# Patient Record
Sex: Female | Born: 2000 | Race: White | Hispanic: No | Marital: Married | Smoking: Never smoker
Health system: Southern US, Community
[De-identification: ages and names within clinical notes are randomized; demographics above are authoritative.]

## PROBLEM LIST (undated history)

## (undated) ENCOUNTER — Emergency Department (HOSPITAL_COMMUNITY): Payer: 59

## (undated) DIAGNOSIS — N809 Endometriosis, unspecified: Secondary | ICD-10-CM

## (undated) DIAGNOSIS — F419 Anxiety disorder, unspecified: Secondary | ICD-10-CM

## (undated) HISTORY — DX: Anxiety disorder, unspecified: F41.9

## (undated) HISTORY — DX: Endometriosis, unspecified: N80.9

## (undated) HISTORY — PX: LAPAROSCOPY: SHX197

## (undated) HISTORY — PX: SEPTOPLASTY: SUR1290

---

## 2021-10-21 ENCOUNTER — Other Ambulatory Visit: Payer: Self-pay

## 2021-10-21 ENCOUNTER — Emergency Department (HOSPITAL_BASED_OUTPATIENT_CLINIC_OR_DEPARTMENT_OTHER)
Admission: EM | Admit: 2021-10-21 | Discharge: 2021-10-21 | Disposition: A | Discharge status: Home / Self Care | Payer: 59 | Attending: Student | Admitting: Student

## 2021-10-21 ENCOUNTER — Encounter (HOSPITAL_BASED_OUTPATIENT_CLINIC_OR_DEPARTMENT_OTHER): Payer: Self-pay

## 2021-10-21 ENCOUNTER — Emergency Department (HOSPITAL_COMMUNITY): Payer: 59

## 2021-10-21 ENCOUNTER — Emergency Department (HOSPITAL_BASED_OUTPATIENT_CLINIC_OR_DEPARTMENT_OTHER): Payer: 59

## 2021-10-21 DIAGNOSIS — R509 Fever, unspecified: Secondary | ICD-10-CM | POA: Diagnosis not present

## 2021-10-21 DIAGNOSIS — R531 Weakness: Secondary | ICD-10-CM | POA: Insufficient documentation

## 2021-10-21 DIAGNOSIS — R519 Headache, unspecified: Secondary | ICD-10-CM | POA: Diagnosis present

## 2021-10-21 DIAGNOSIS — G43809 Other migraine, not intractable, without status migrainosus: Secondary | ICD-10-CM

## 2021-10-21 DIAGNOSIS — R202 Paresthesia of skin: Secondary | ICD-10-CM | POA: Insufficient documentation

## 2021-10-21 LAB — BASIC METABOLIC PANEL
Anion gap: 9 (ref 5–15)
BUN: 11 mg/dL (ref 6–20)
CO2: 24 mmol/L (ref 22–32)
Calcium: 9.5 mg/dL (ref 8.9–10.3)
Chloride: 106 mmol/L (ref 98–111)
Creatinine, Ser: 0.82 mg/dL (ref 0.44–1.00)
GFR, Estimated: >60 mL/min (ref >60)
Glucose, Bld: 123 mg/dL — ABNORMAL HIGH (ref 70–99)
Potassium: 3.8 mmol/L (ref 3.5–5.1)
Sodium: 139 mmol/L (ref 135–145)

## 2021-10-21 LAB — URINALYSIS, ROUTINE W REFLEX MICROSCOPIC
Bilirubin Urine: NEGATIVE
Glucose, UA: NEGATIVE mg/dL
Hgb urine dipstick: NEGATIVE
Ketones, ur: NEGATIVE mg/dL
Leukocytes,Ua: NEGATIVE
Nitrite: NEGATIVE
Protein, ur: NEGATIVE mg/dL
Specific Gravity, Urine: 1.01 (ref 1.005–1.030)
pH: 6 (ref 5.0–8.0)

## 2021-10-21 LAB — CBC
HCT: 36.6 % (ref 36.0–46.0)
Hemoglobin: 11.4 g/dL — ABNORMAL LOW (ref 12.0–15.0)
MCH: 25.7 pg — ABNORMAL LOW (ref 26.0–34.0)
MCHC: 31.1 g/dL (ref 30.0–36.0)
MCV: 82.6 fL (ref 80.0–100.0)
Platelets: 293 10*3/uL (ref 150–400)
RBC: 4.43 MIL/uL (ref 3.87–5.11)
RDW: 14.2 % (ref 11.5–15.5)
WBC: 6.4 10*3/uL (ref 4.0–10.5)
nRBC: 0 % (ref 0.0–0.2)

## 2021-10-21 LAB — PREGNANCY, URINE: Preg Test, Ur: NEGATIVE

## 2021-10-21 IMAGING — CT CT HEAD W/O CM
4 series · 16 of 47 positions shown, 18 images · non-contrast
Comparison: No pertinent prior exams available for comparison.

CLINICAL DATA: Headache, new or worsening, neuro deficit. Headache,
left-sided weakness, chronic migraines (associated with nausea and
blurry vision).



[Series 2: head wo · axial · 0.45mm/px · z∈[+1054,+1174]mm · 7 of 32 slices shown, 9 images]
[im 4/32  brain]
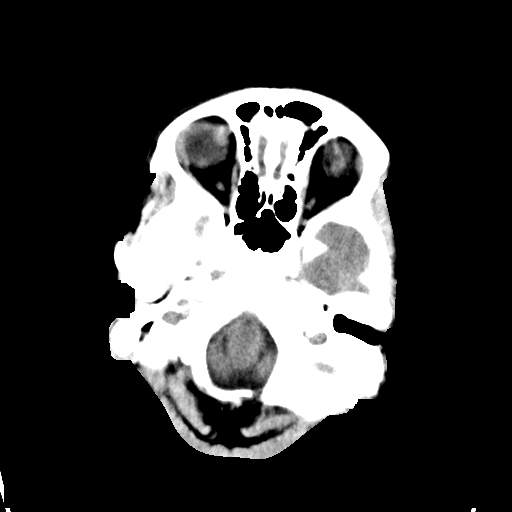
[im 4/32  bone]
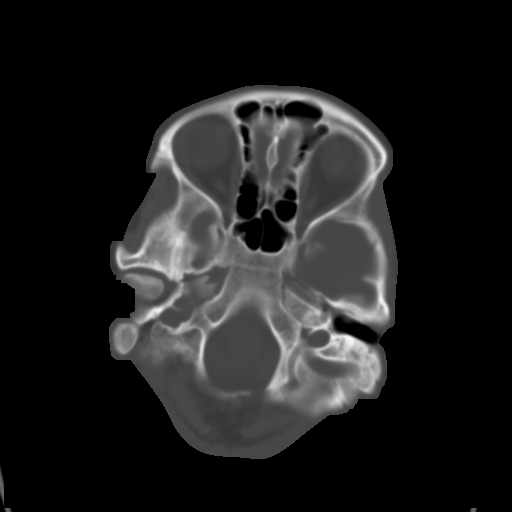
[im 8/32  brain]
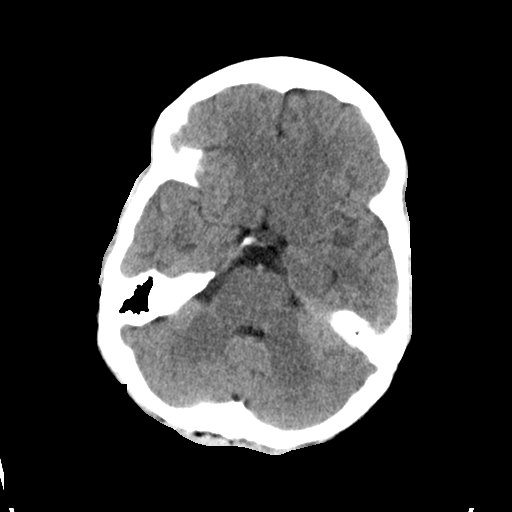
[im 12/32  brain]
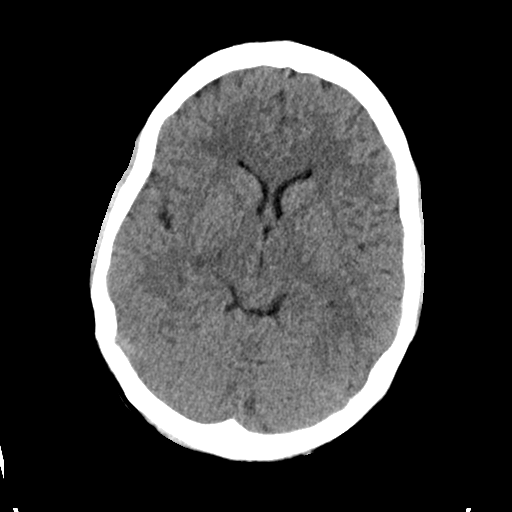
[im 16/32  brain]
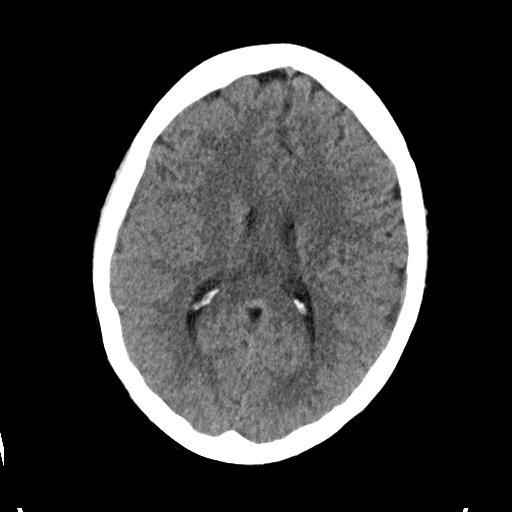
[im 20/32  brain]
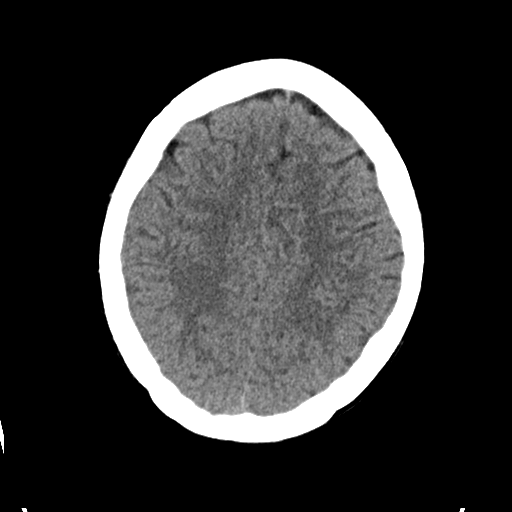
[im 20/32  bone]
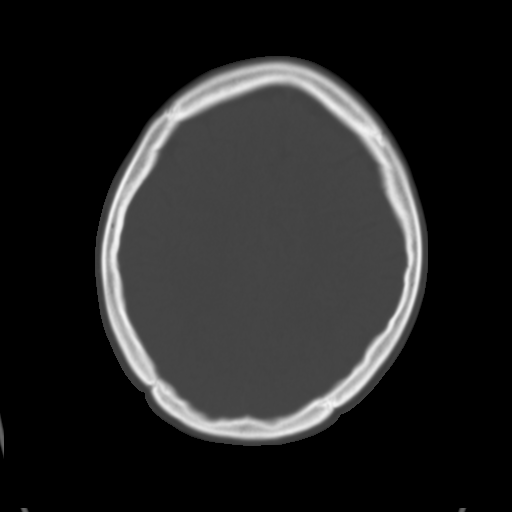
[im 24/32  brain]
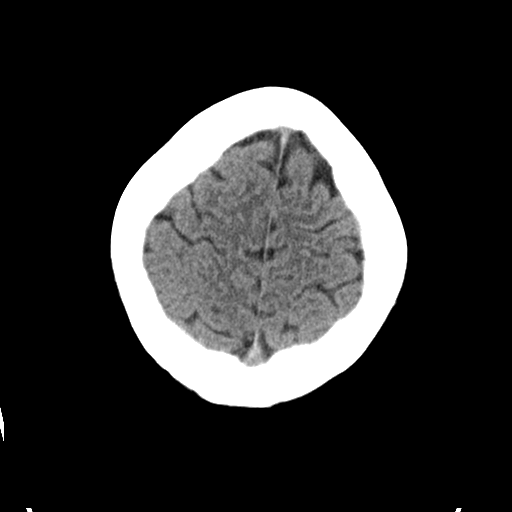
[im 28/32  brain]
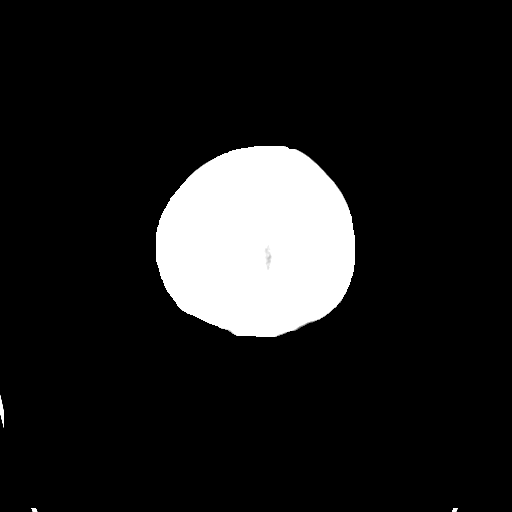

[Series 3: head bone · axial · 0.45mm/px · z∈[+1053,+1085]mm · 3 of 79 slices shown]
[im 8/79  bone]
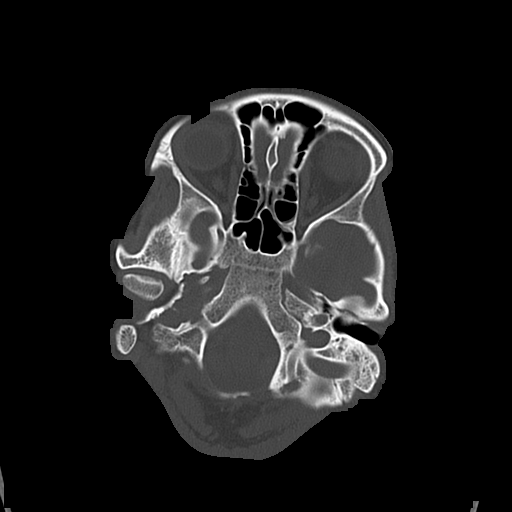
[im 16/79  bone]
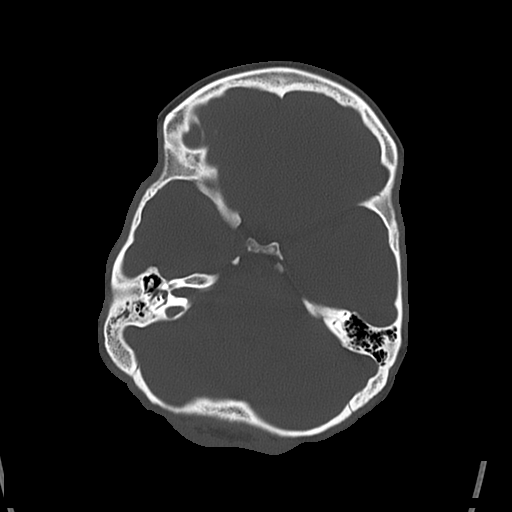
[im 24/79  bone]
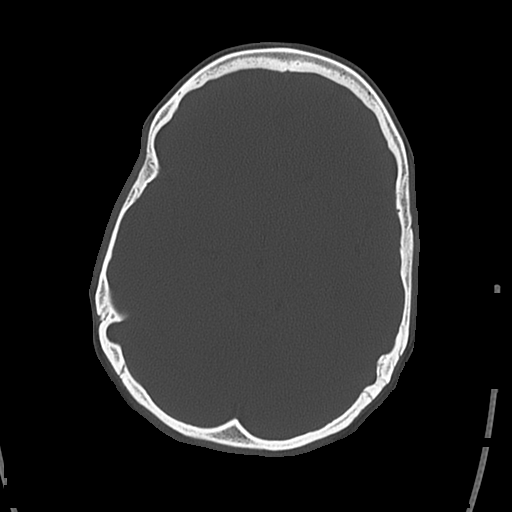

[Series 4: coronal soft · coronal · 0.32mm/px · 3 of 69 slices shown]
[im 26/69  brain]
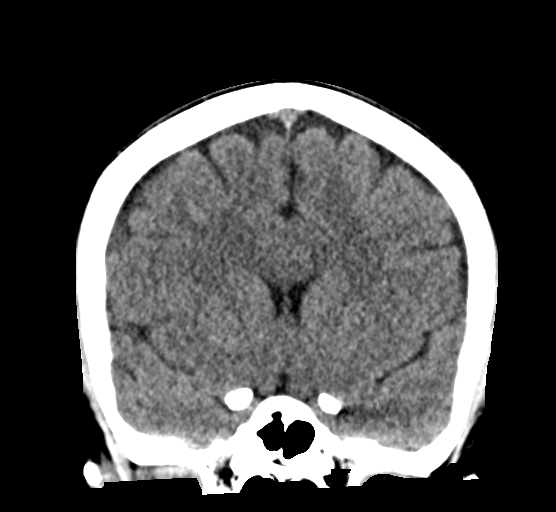
[im 32/69  brain]
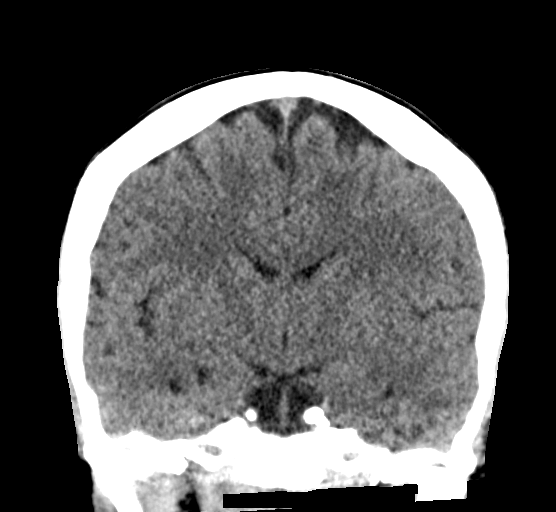
[im 37/69  brain]
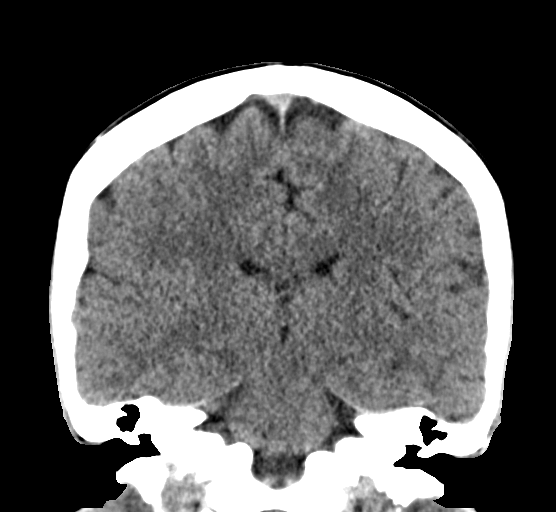

[Series 5: sagittal soft · sagittal · 0.32mm/px · 3 of 60 slices shown]
[im 20/60  brain]
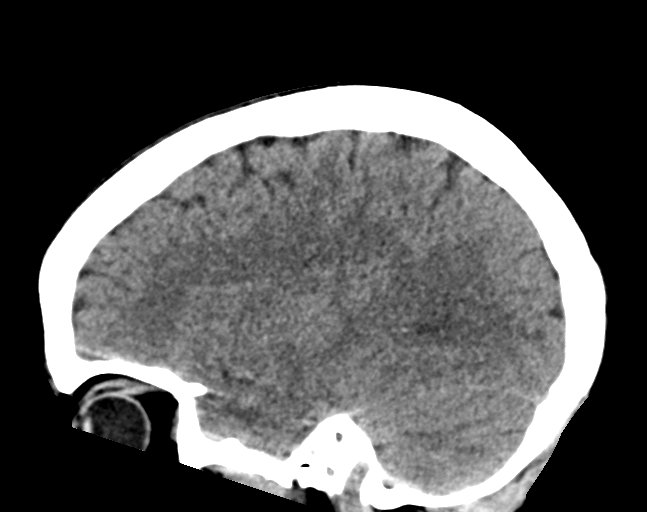
[im 30/60  brain]
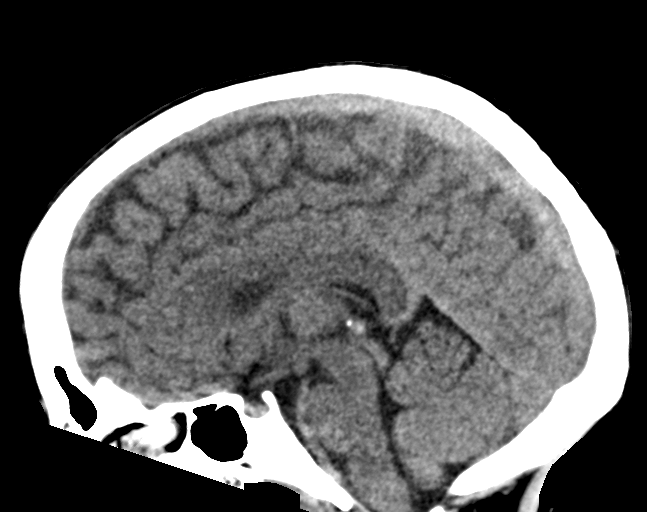
[im 40/60  brain]
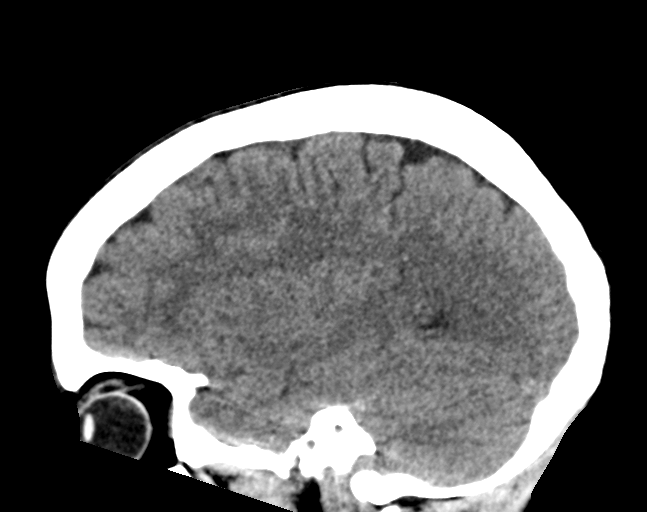

[16 of 47 positions shown; findings below may reference images not displayed]

FINDINGS: Brain:

Cerebral volume is normal.

There is no acute intracranial hemorrhage.

No demarcated cortical infarct.

No extra-axial fluid collection.

No evidence of an intracranial mass.

No midline shift.

Vascular: No hyperdense vessel.

Skull: No calvarial fracture or focal suspicious osseous lesion.

Sinuses/Orbits: Mild mucosal thickening within the bilateral ethmoid
and left maxillary sinuses at the imaged levels.
IMPRESSION: No evidence of acute intracranial abnormality.

Mild mucosal thickening within the bilateral ethmoid and left
maxillary sinuses at the imaged levels.

## 2021-10-21 IMAGING — MR MR HEAD W/O CM
7 of 11 series · 25 of 48 positions shown · non-contrast
Comparison: None.

CLINICAL DATA: Sudden headache with left-sided weakness

EXAM:
MRI HEAD WITHOUT CONTRAST
TECHNIQUE: Multiplanar, multiecho pulse sequences of the brain and surrounding
structures were obtained without intravenous contrast.

[Series 2: DWI · axial · 3.0mm · 0.94mm/px · z∈[-50,+96]mm · 6 of 99 slices shown (1 of 2)]
[im 1/99]
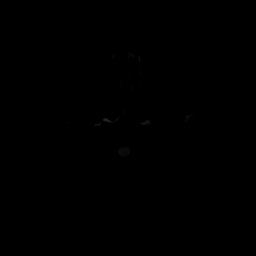
[im 20/99]
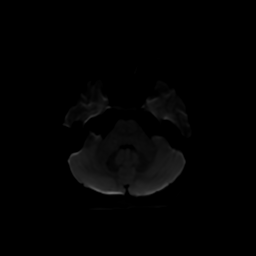
[im 40/99]
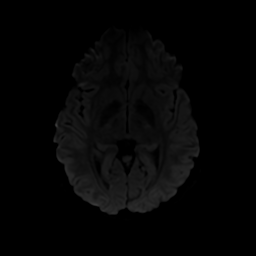
[im 59/99]
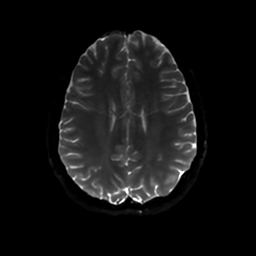
[im 79/99]
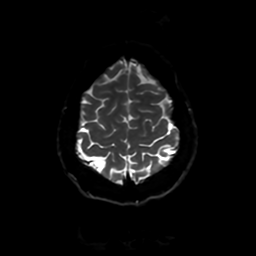
[im 99/99]
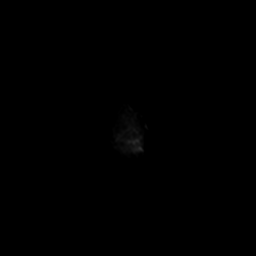

[Series 3: DWI · coronal · 4.0mm · 0.94mm/px · 6 of 78 slices shown (2 of 2)]
[im 1/78]
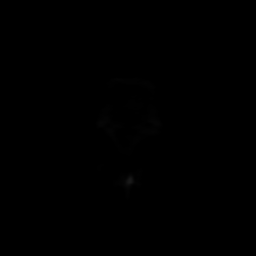
[im 16/78]
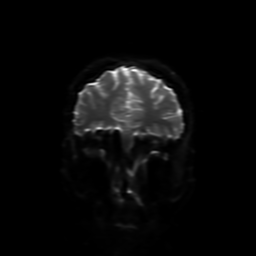
[im 31/78]
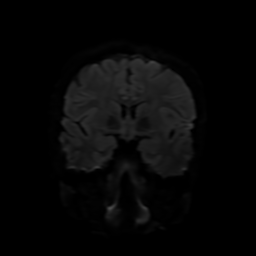
[im 47/78]
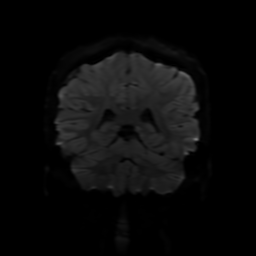
[im 62/78]
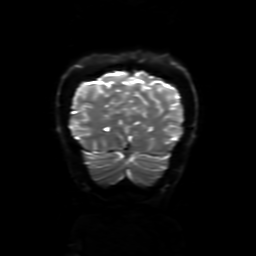
[im 78/78]
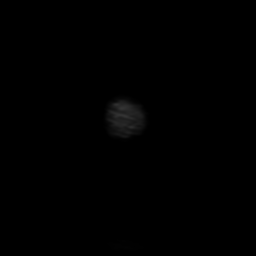

[Series 4: FLAIR · sagittal · 5.0mm · 0.23mm/px · 2 of 26 slices shown (1 of 2)]
[im 1/26]
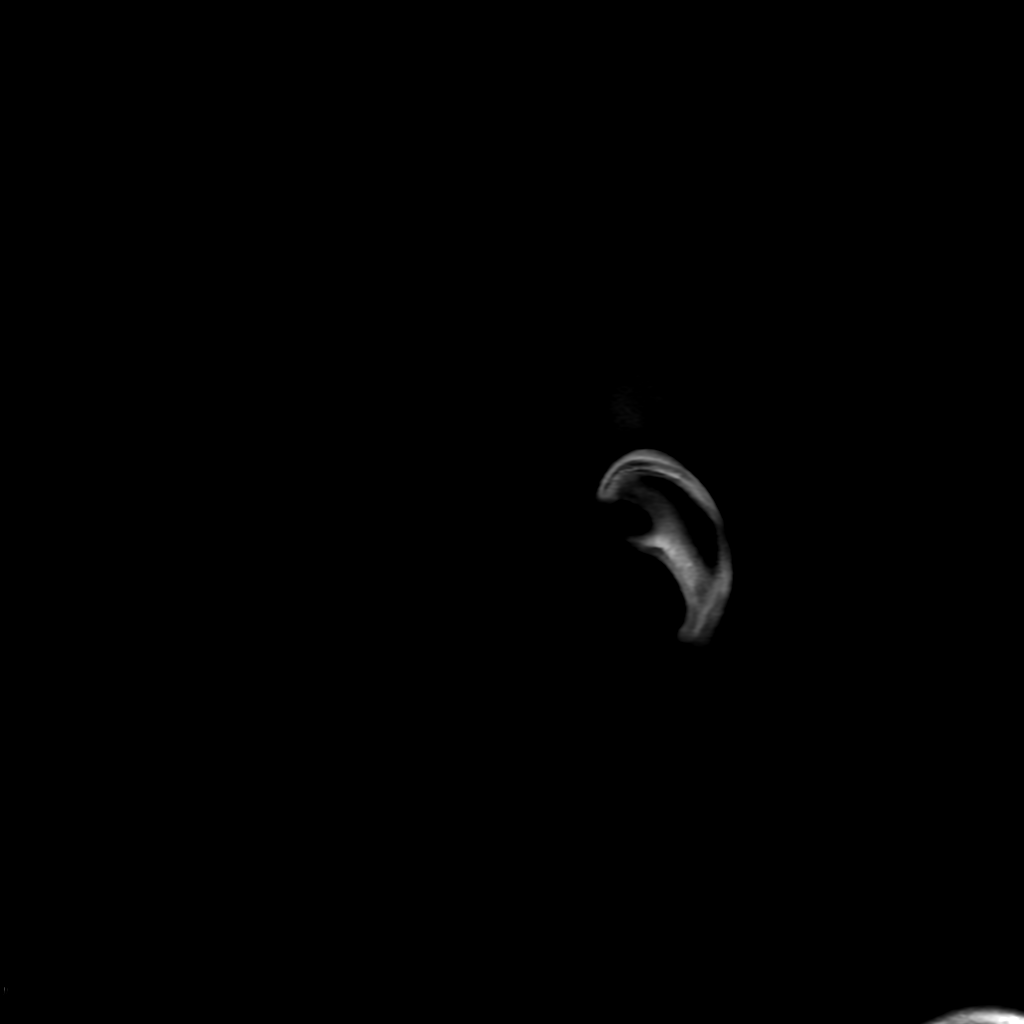
[im 26/26]
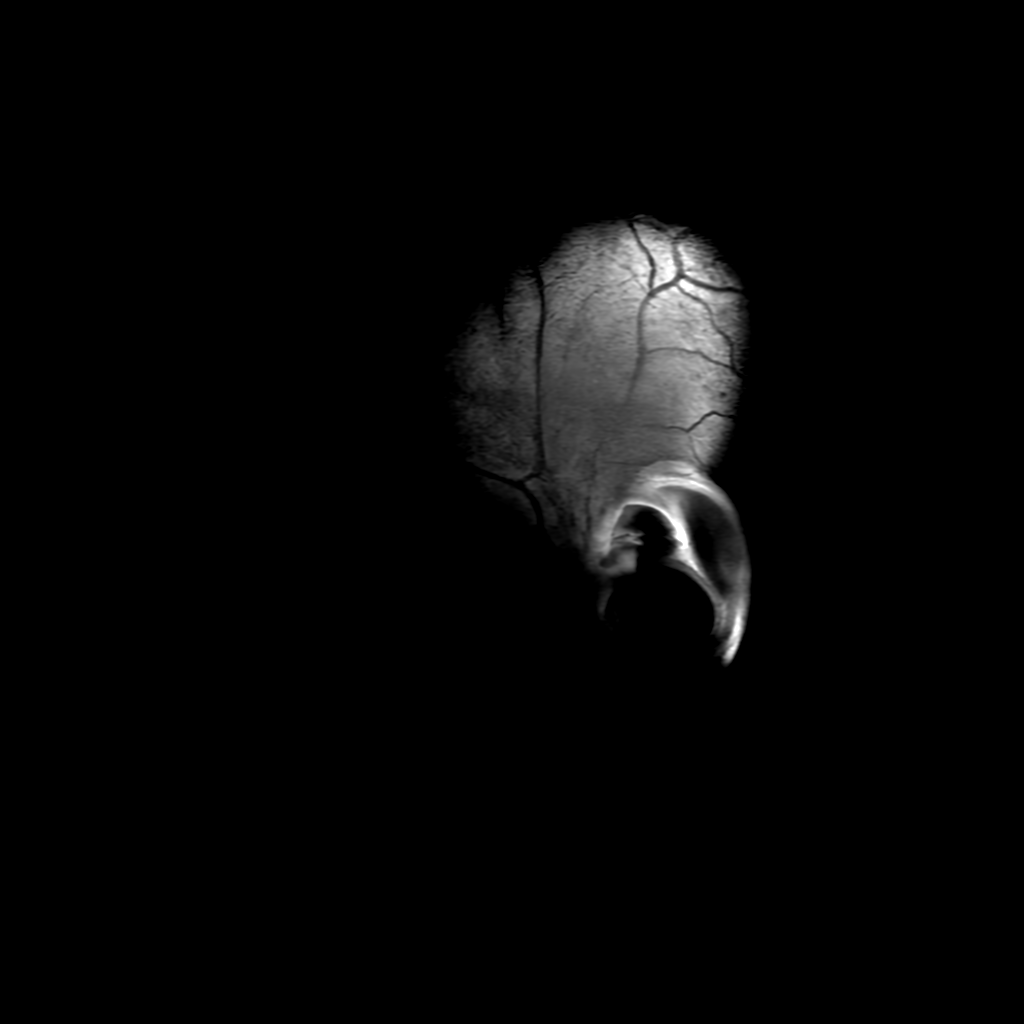

[Series 5: T2 · axial · 5.0mm · 0.23mm/px · 1 of 26 slices shown]
[im 1/26]
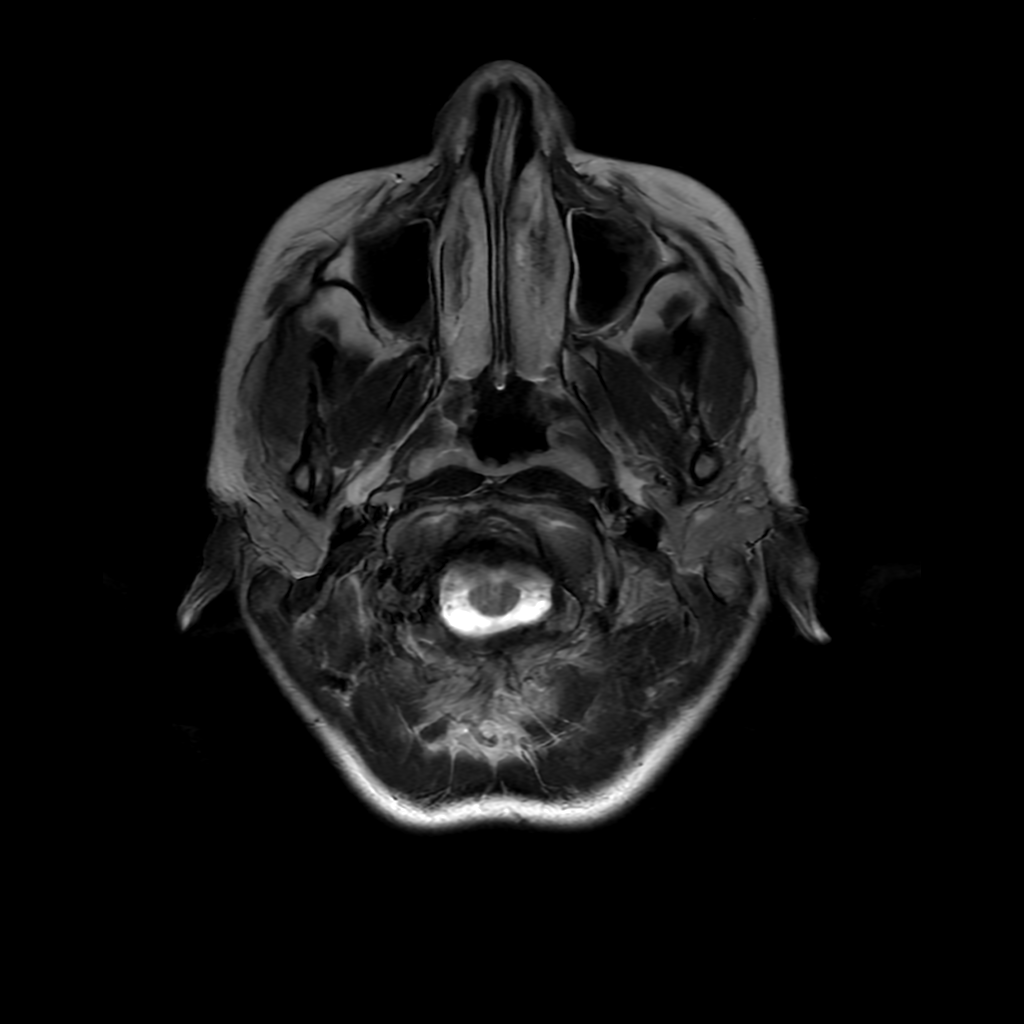

[Series 6: FLAIR · axial · 4.0mm · 0.45mm/px · z∈[-50,+98]mm · 3 of 35 slices shown (2 of 2)]
[im 1/35]
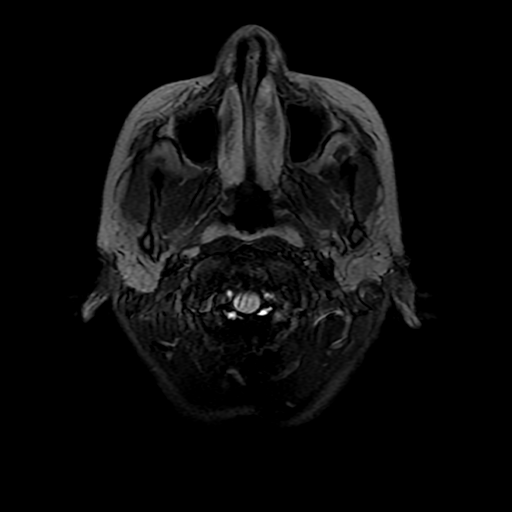
[im 18/35]
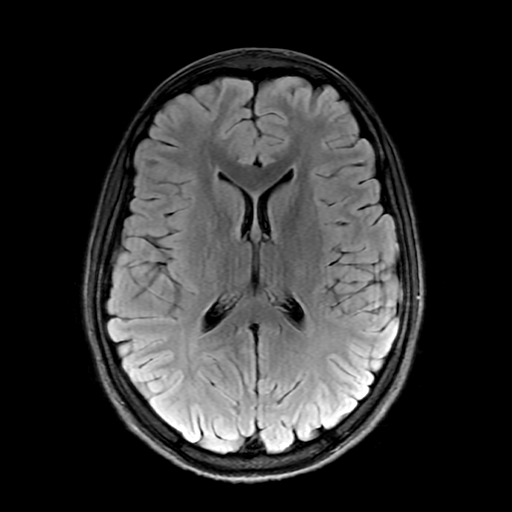
[im 35/35]
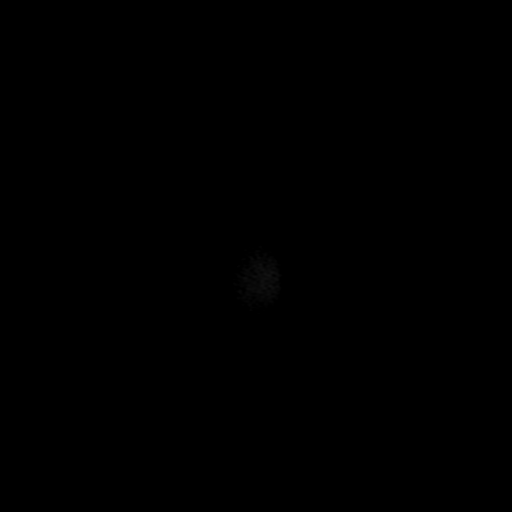

[Series 250: ADC · axial · 3.0mm · 0.94mm/px · z∈[-50,+96]mm · 4 of 50 slices shown (1 of 2)]
[im 1/50]
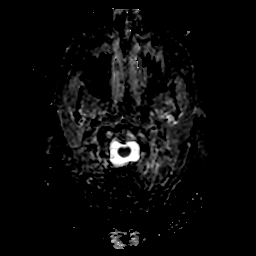
[im 17/50]
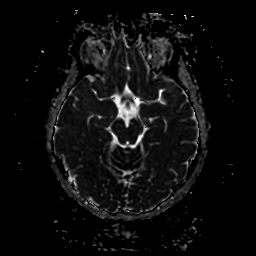
[im 33/50]
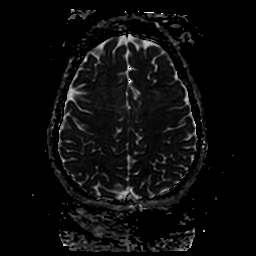
[im 50/50]
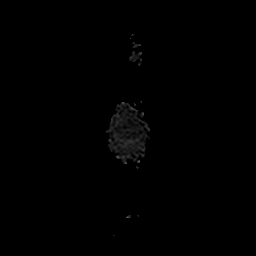

[Series 350: ADC · coronal · 4.0mm · 0.94mm/px · 3 of 39 slices shown (2 of 2)]
[im 1/39]
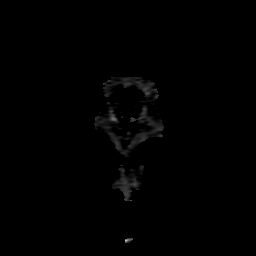
[im 20/39]
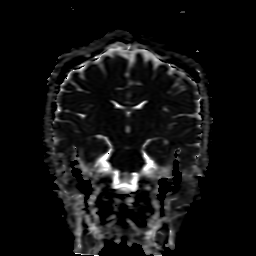
[im 39/39]
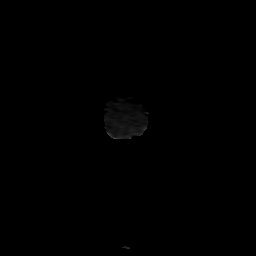

[25 of 48 positions shown; findings below may reference images not displayed]

FINDINGS: Brain: No acute infarct, mass effect or extra-axial collection. No
acute or chronic hemorrhage. Normal white matter signal, parenchymal
volume and CSF spaces. The midline structures are normal.

Vascular: Major flow voids are preserved.

Skull and upper cervical spine: Normal calvarium and skull base.
Visualized upper cervical spine and soft tissues are normal.

Sinuses/Orbits:No paranasal sinus fluid levels or advanced mucosal
thickening. No mastoid or middle ear effusion. Normal orbits.
IMPRESSION: Normal brain MRI.

## 2021-10-21 IMAGING — MR MR MRV HEAD WO/W CM
3 of 4 series · 20 of 48 positions shown · IV contrast (5.5 m GAD)
Comparison: No pertinent prior exam.

CLINICAL DATA: Headache

EXAM:
MR VENOGRAM HEAD WITHOUT AND WITH CONTRAST
TECHNIQUE: Angiographic images of the intracranial venous structures were
acquired using MRV technique without and with intravenous contrast.
CONTRAST:  5.5mL GADAVIST GADOBUTROL 1 MMOL/ML IV SOLN

[Series 3: MRV · coronal · 1.5mm · 0.43mm/px · 7 of 129 slices shown]
[im 1/129]
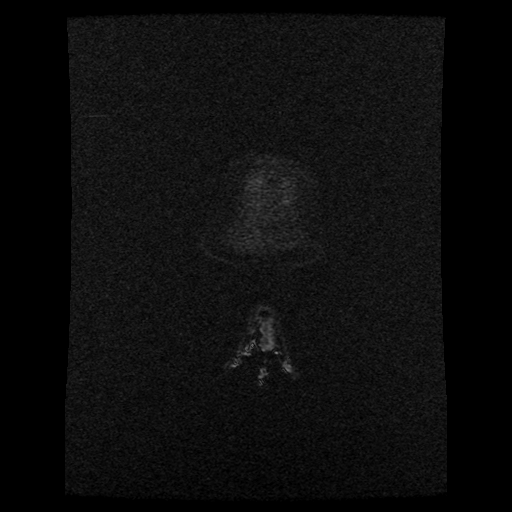
[im 22/129]
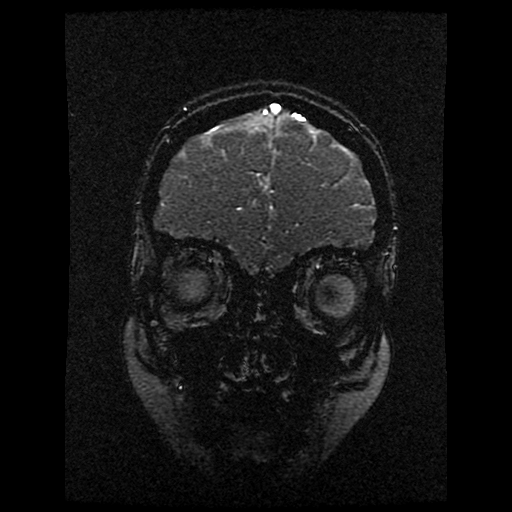
[im 43/129]
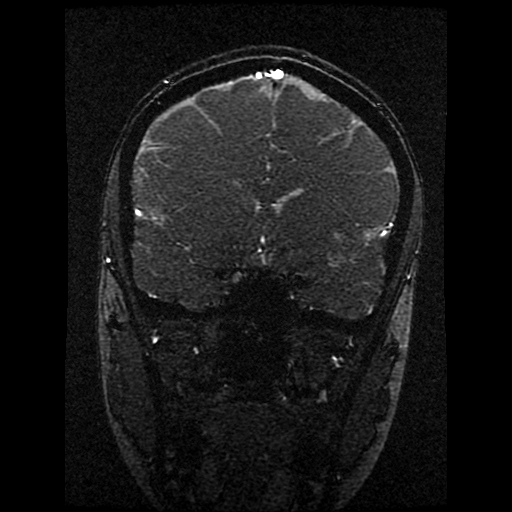
[im 65/129]
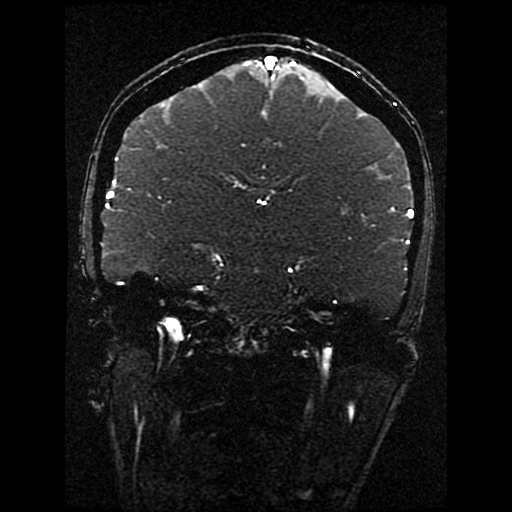
[im 86/129]
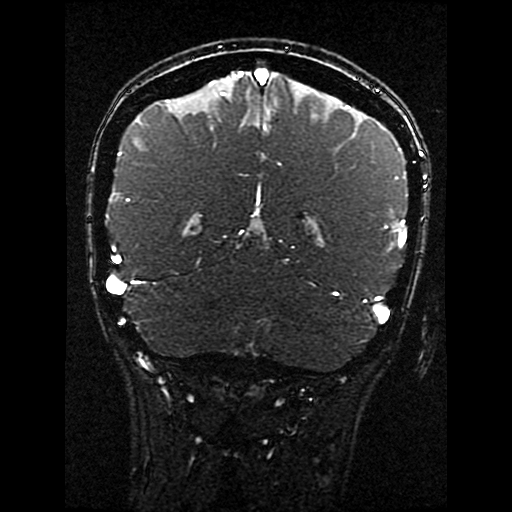
[im 107/129]
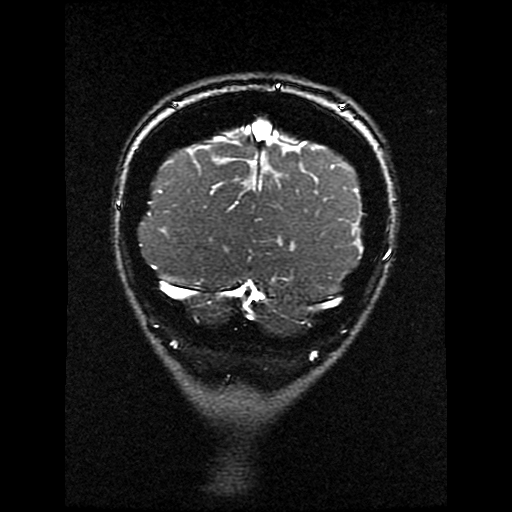
[im 129/129]
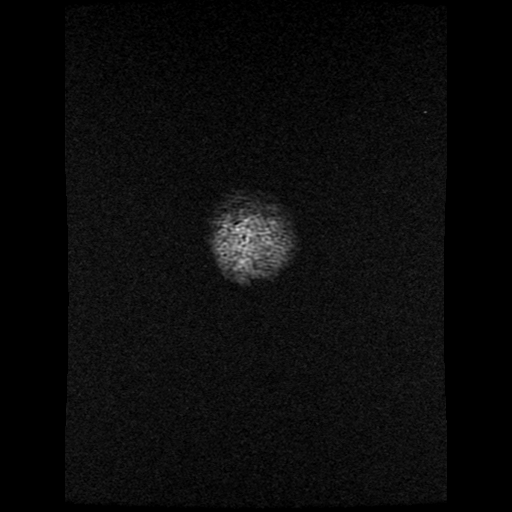

[Series 4: sag inhance (id) · sagittal · 1.8mm · 0.47mm/px · 9 of 331 slices shown]
[im 19/331]
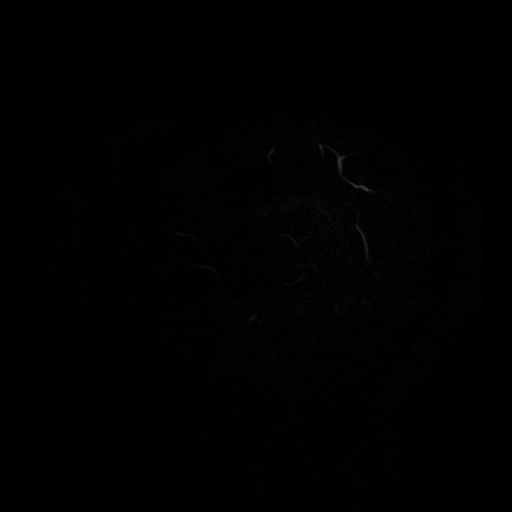
[im 56/331]
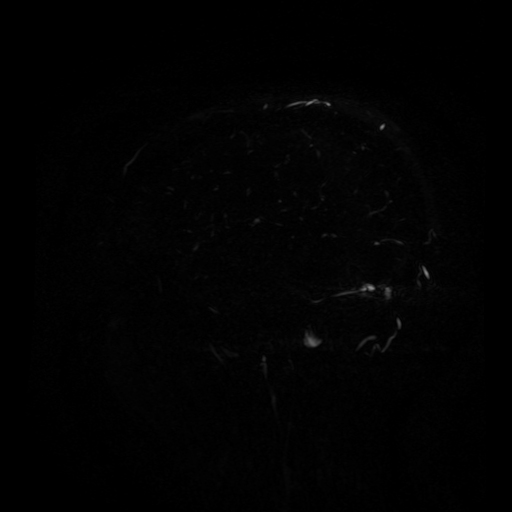
[im 92/331]
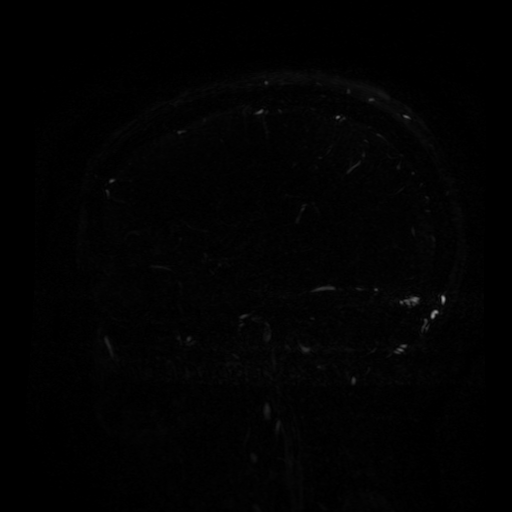
[im 147/331]
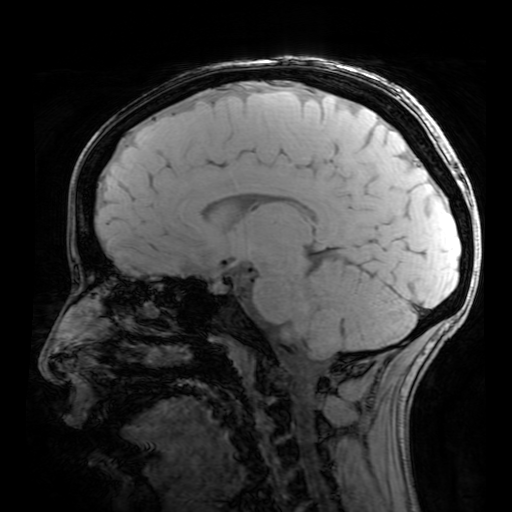
[im 166/331]
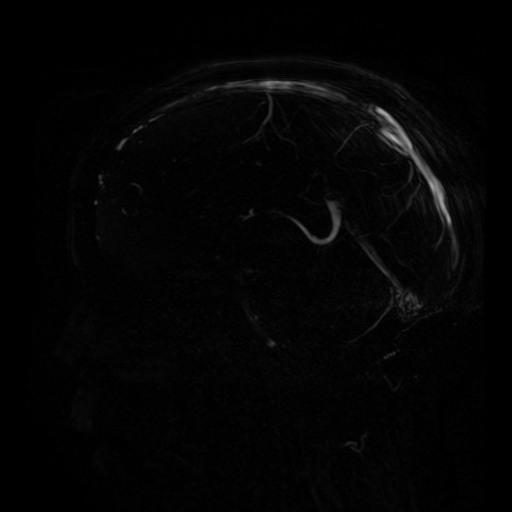
[im 184/331]
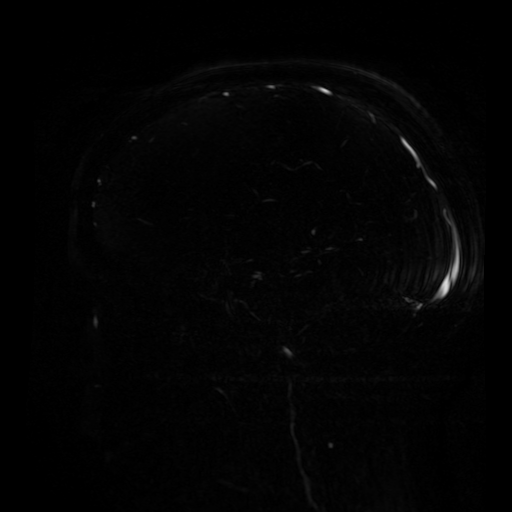
[im 239/331]
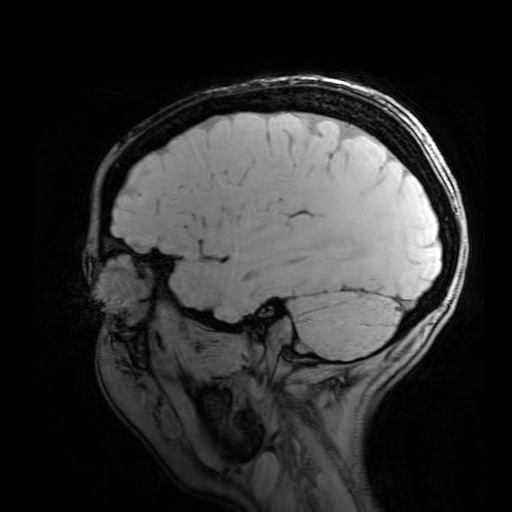
[im 276/331]
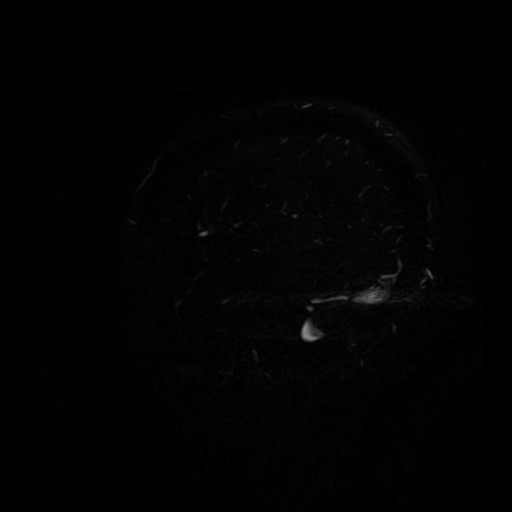
[im 312/331]
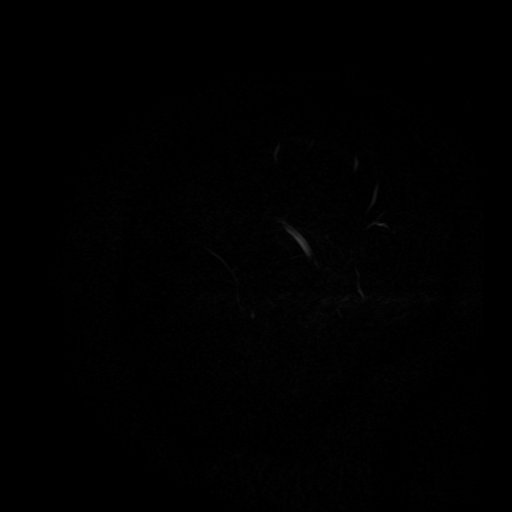

[Series 500: multiplanar reconstruction (mpr) · sagittal · 0.9mm · 0.47mm/px · 4 of 205 slices shown]
[im 1/205]
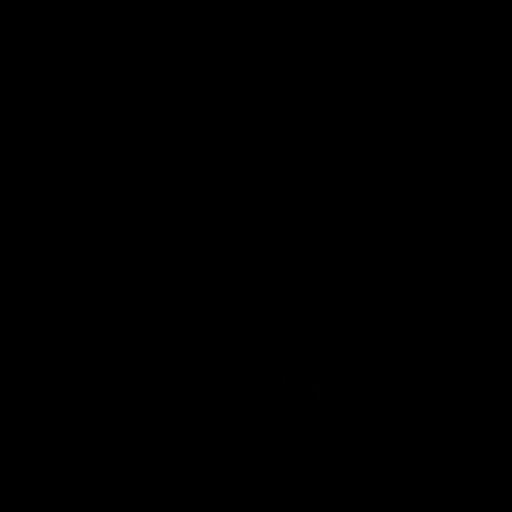
[im 38/205]
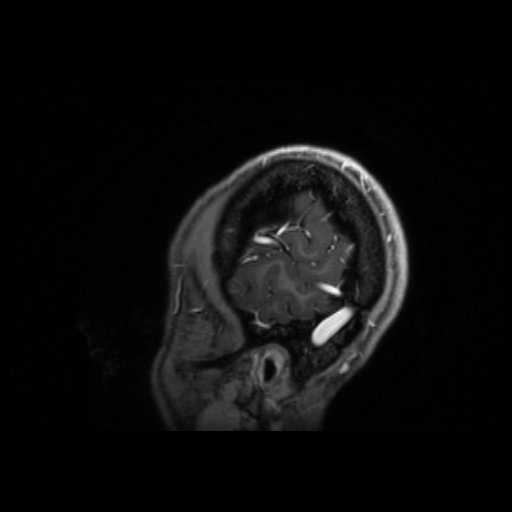
[im 112/205]
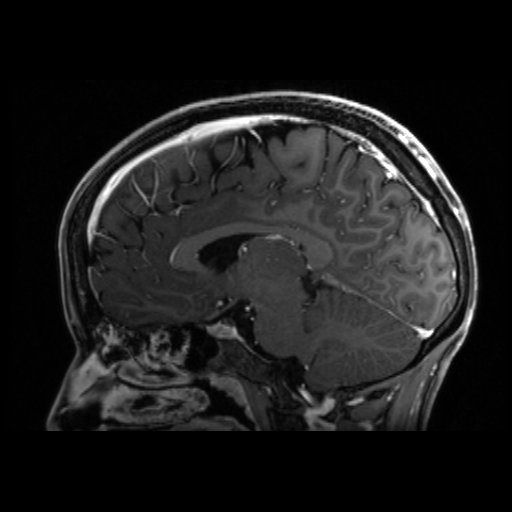
[im 186/205]
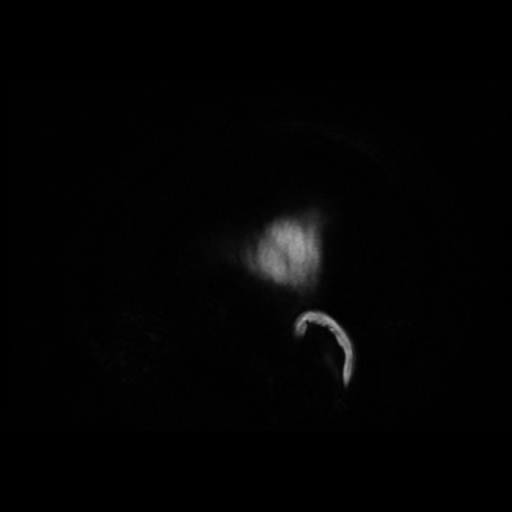

[20 of 48 positions shown; findings below may reference images not displayed]

FINDINGS: Superior sagittal sinus: Normal.

Straight sinus: Normal.

Inferior sagittal sinus, vein of HATTO and internal cerebral veins:
Normal.

Transverse sinuses: Normal.

Sigmoid sinuses: Normal.

Visualized jugular veins: Normal.
IMPRESSION: Normal intracranial MRV.

## 2021-10-21 MED ORDER — KETOROLAC TROMETHAMINE 15 MG/ML IJ SOLN
15.0000 mg | Freq: Once | INTRAMUSCULAR | Status: DC
Start: 2021-10-21 — End: 2021-10-21
  Filled 2021-10-21: qty 1

## 2021-10-21 MED ORDER — DEXAMETHASONE SODIUM PHOSPHATE 10 MG/ML IJ SOLN: 8.0000 mg | Freq: Once | INTRAMUSCULAR | Status: DC

## 2021-10-21 MED ORDER — PROCHLORPERAZINE EDISYLATE 10 MG/2ML IJ SOLN
10.0000 mg | Freq: Once | INTRAMUSCULAR | Status: AC
  Administered 2021-10-21: 10 mg via INTRAVENOUS
  Filled 2021-10-21: qty 2

## 2021-10-21 MED ORDER — LACTATED RINGERS IV BOLUS
1000.0000 mL | Freq: Once | INTRAVENOUS | Status: AC
  Administered 2021-10-21: 1000 mL via INTRAVENOUS

## 2021-10-21 MED ORDER — KETOROLAC TROMETHAMINE 15 MG/ML IJ SOLN: 15.0000 mg | Freq: Once | INTRAMUSCULAR | Status: DC

## 2021-10-21 MED ORDER — GADOBUTROL 1 MMOL/ML IV SOLN
5.5000 mL | Freq: Once | INTRAVENOUS | Status: AC | PRN
  Administered 2021-10-21: 5.5 mL via INTRAVENOUS

## 2021-10-21 MED ORDER — DEXAMETHASONE 4 MG PO TABS
6.0000 mg | ORAL_TABLET | Freq: Once | ORAL | Status: AC
  Administered 2021-10-21: 6 mg via ORAL
  Filled 2021-10-21: qty 2

## 2021-10-21 MED ORDER — BUTALBITAL-APAP-CAFFEINE 50-325-40 MG PO TABS: 1.0000 | ORAL_TABLET | Freq: Four times a day (QID) | ORAL | Status: DC | PRN

## 2021-10-21 MED ORDER — MAGNESIUM SULFATE IN D5W 1-5 GM/100ML-% IV SOLN
1.0000 g | Freq: Once | INTRAVENOUS | Status: DC
Start: 2021-10-21 — End: 2021-10-21

## 2021-10-21 MED ORDER — DIPHENHYDRAMINE HCL 50 MG/ML IJ SOLN
25.0000 mg | Freq: Once | INTRAMUSCULAR | Status: AC
  Administered 2021-10-21: 25 mg via INTRAVENOUS
  Filled 2021-10-21: qty 1

## 2021-10-21 NOTE — ED Notes (Signed)
Pt is in MRI at this time.

## 2021-10-21 NOTE — ED Provider Notes (Signed)
Physical Exam  BP 126/67 (BP Location: Right Arm)    Pulse 70    Temp 97.9 F (36.6 C) (Oral)    Resp 18    Ht 5\' 5"  (1.651 m)    Wt 54.9 kg    SpO2 100%    BMI 20.14 kg/m   Physical Exam Vitals and nursing note reviewed.  Constitutional:      General: She is not in acute distress.    Appearance: Normal appearance.  HENT:     Head: Normocephalic and atraumatic.     Right Ear: External ear normal.     Left Ear: External ear normal.     Nose: Nose normal.     Mouth/Throat:     Mouth: Mucous membranes are moist.  Eyes:     General: No visual field deficit or scleral icterus.       Right eye: No discharge.        Left eye: No discharge.     Extraocular Movements: Extraocular movements intact.     Pupils: Pupils are equal, round, and reactive to light.  Cardiovascular:     Rate and Rhythm: Normal rate and regular rhythm.     Pulses: Normal pulses.     Heart sounds: Normal heart sounds.  Pulmonary:     Effort: Pulmonary effort is normal. No respiratory distress.     Breath sounds: Normal breath sounds.  Abdominal:     General: Abdomen is flat.     Tenderness: There is no abdominal tenderness.  Musculoskeletal:        General: Normal range of motion.     Cervical back: Full passive range of motion without pain and normal range of motion.     Right lower leg: No edema.     Left lower leg: No edema.  Skin:    General: Skin is warm and dry.     Capillary Refill: Capillary refill takes less than 2 seconds.  Neurological:     Mental Status: She is alert and oriented to person, place, and time.     GCS: GCS eye subscore is 4. GCS verbal subscore is 5. GCS motor subscore is 6.     Cranial Nerves: Cranial nerves 2-12 are intact. No dysarthria or facial asymmetry.     Sensory: Sensation is intact.     Motor: Motor function is intact. No tremor.     Coordination: Coordination is intact.     Gait: Gait is intact.  Psychiatric:        Mood and Affect: Mood normal.        Behavior:  Behavior normal.    Procedures  Procedures  ED Course / MDM    Medical Decision Making Amount and/or Complexity of Data Reviewed Labs: ordered. Radiology: ordered.  Risk Prescription drug management.   21 year old female history of endometriosis arrived from med [address] [facility] secondary to headache, neurologic changes.  She was sent to this facility to obtain MRI/MRV this is unavailable at that facility.  Patient with left-sided weakness, gait disturbance, numbness.  CT head was unremarkable, migraine cocktail without significant improvement.  Prior EDP spoke with neurology who recommended MRI/MRV.  Imaging has resulted and is nonacute  Patient feels she is back to her baseline.  D/w neurology on-call who recommends outpatient follow-up in the office.  Return referral to neurology was placed.  We will give patient some oral Decadron.  Outpatient prescription for Fioricet  Favor complex migraine as etiology of her complaints today.  Repeat neurologic exam is non focal   She is ambulatory with a steady gait.  Tolerating p.o.  Stable for discharge.  The patient improved significantly and was discharged in stable condition. Detailed discussions were had with the patient regarding current findings, and need for close f/u with PCP or on call doctor. The patient has been instructed to return immediately if the symptoms worsen in any way for re-evaluation. Patient verbalized understanding and is in agreement with current care plan. All questions answered prior to discharge.        Sloan Leiter, DO 10/21/21 1956

## 2021-10-21 NOTE — ED Notes (Signed)
Report called to Asher Muir, Charity fundraiser. RN reports she already spoke to the EDP.

## 2021-10-21 NOTE — Discharge Instructions (Addendum)
It was a pleasure caring for you today in the emergency department.  Please follow-up with neurology  Please return to the emergency department for any worsening or worrisome symptoms.

## 2021-10-21 NOTE — ED Triage Notes (Signed)
Patient here POV from Home with Headache.  Patient states she gets Migraines approximately 3-4 times a month but endorses having a Migraine for approximately 3 weeks.   States Pain has been worsening and been constant.   Also endorses Dizziness, Nausea, Emesis, Photosensitivity. Endorses "Low-Grade Fevers" as well.   NAD Noted during Triage. A&Ox4. GCS 15. Ambulatory.

## 2021-10-21 NOTE — ED Provider Notes (Signed)
[facility] EMERGENCY DEPT Provider Note  CSN: [id number] Arrival date & time: 10/21/21 1237  Chief Complaint(s) Headache  HPI Andrea Dyer is a 21 y.o. female with PMH endometriosis on a GRnH antagonist who presents to the emergency department for evaluation of headache.  Patient states that she has had the history of migraines in the past but is not on any current medication for this.  She states that she has had intermittent upper respiratory symptoms over the last 1 month and was treated for strep throat.  She states that she still has pharyngitis type symptoms have not entirely resolved but she developed a headache over the last week that has progressively worsened in intensity.  Perhaps most concerning, she has been developing progressively worsening left upper and left lower extremity weakness and states that she is having trouble walking now, prompting a visit to the emergency department.  She states that she has had intermittent fevers at home Tmax 101.0.  Denies chest pain, shortness of breath, abdominal pain, vomiting or other systemic symptoms.   Headache Associated symptoms: weakness    Past Medical History History reviewed. No pertinent past medical history. There are no problems to display for this patient.  Home Medication(s) Prior to Admission medications   Not on File                                                                                                                                    Past Surgical History History reviewed. No pertinent surgical history. Family History No family history on file.  Social History Social History   Tobacco Use   Smoking status: Never   Smokeless tobacco: Never  Substance Use Topics   Alcohol use: Yes    Comment: Occasionally   Drug use: Never   Allergies Amoxicillin and Penicillins  Review of Systems Review of Systems  Neurological:  Positive for weakness and headaches.   Physical Exam Vital Signs   I have reviewed the triage vital signs BP (!) 143/112    Pulse 86    Temp 97.9 F (36.6 C) (Oral)    Resp (!) 24    Ht 5\' 5"  (1.651 m)    Wt 54.9 kg    SpO2 100%    BMI 20.14 kg/m   Physical Exam Vitals and nursing note reviewed.  Constitutional:      General: She is not in acute distress.    Appearance: She is well-developed.  HENT:     Head: Normocephalic and atraumatic.     Comments: Right tonsillar enlargement Eyes:     Conjunctiva/sclera: Conjunctivae normal.  Cardiovascular:     Rate and Rhythm: Normal rate and regular rhythm.     Heart sounds: No murmur heard. Pulmonary:     Effort: Pulmonary effort is normal. No respiratory distress.     Breath sounds: Normal breath sounds.  Abdominal:     Palpations: Abdomen is soft.  Tenderness: There is no abdominal tenderness.  Musculoskeletal:        General: No swelling.     Cervical back: Neck supple.  Skin:    General: Skin is warm and dry.     Capillary Refill: Capillary refill takes less than 2 seconds.  Neurological:     Mental Status: She is alert.     Cranial Nerves: No cranial nerve deficit.     Sensory: Sensory deficit present.     Motor: Weakness present.  Psychiatric:        Mood and Affect: Mood normal.    ED Results and Treatments Labs (all labs ordered are listed, but only abnormal results are displayed) Labs Reviewed  BASIC METABOLIC PANEL - Abnormal; Notable for the following components:      Result Value   Glucose, Bld 123 (*)    All other components within normal limits  CBC - Abnormal; Notable for the following components:   Hemoglobin 11.4 (*)    MCH 25.7 (*)    All other components within normal limits  URINALYSIS, ROUTINE W REFLEX MICROSCOPIC - Abnormal; Notable for the following components:   Color, Urine COLORLESS (*)    All other components within normal limits  PREGNANCY, URINE                                                                                                                           Radiology CT Head Wo Contrast  Result Date: 10/21/2021 CLINICAL DATA:  Headache, new or worsening, neuro deficit. Headache, left-sided weakness, chronic migraines (associated with nausea and blurry vision). EXAM: CT HEAD WITHOUT CONTRAST TECHNIQUE: Contiguous axial images were obtained from the base of the skull through the vertex without intravenous contrast. RADIATION DOSE REDUCTION: This exam was performed according to the departmental dose-optimization program which includes automated exposure control, adjustment of the mA and/or kV according to patient size and/or use of iterative reconstruction technique. COMPARISON:  No pertinent prior exams available for comparison. FINDINGS: Brain: Cerebral volume is normal. There is no acute intracranial hemorrhage. No demarcated cortical infarct. No extra-axial fluid collection. No evidence of an intracranial mass. No midline shift. Vascular: No hyperdense vessel. Skull: No calvarial fracture or focal suspicious osseous lesion. Sinuses/Orbits: Mild mucosal thickening within the bilateral ethmoid and left maxillary sinuses at the imaged levels. IMPRESSION: No evidence of acute intracranial abnormality. Mild mucosal thickening within the bilateral ethmoid and left maxillary sinuses at the imaged levels. Electronically Signed   By: Jackey Loge D.O.   On: 10/21/2021 14:07    Pertinent labs & imaging results that were available during my care of the patient were reviewed by me and considered in my medical decision making (see MDM for details).  Medications Ordered in ED Medications  prochlorperazine (COMPAZINE) injection 10 mg (10 mg Intravenous Given 10/21/21 1330)  diphenhydrAMINE (BENADRYL) injection 25 mg (25 mg Intravenous Given 10/21/21 1331)  lactated ringers bolus 1,000 mL (1,000 mLs Intravenous New Bag/Given 10/21/21 1336)  Procedures Procedures  (including critical care time)  Medical Decision Making / ED Course   This patient presents to the ED for concern of headache, weakness, this involves an extensive number of treatment options, and is a complaint that carries with it a high risk of complications and morbidity.  The differential diagnosis includes migraine headache, dural venous sinus thrombosis, brain abscess, malignancy, conversion disorder  MDM: Patient seen emergency department for evaluation of a headache.  Physical exam is concerning with left upper and left lower extremity weakness and subjective numbness.  No cranial nerve deficits.  Laboratory evaluation largely unremarkable, pregnancy test negative, UA negative.  CT head unremarkable.  Patient given headache cocktail with Compazine and Benadryl and patient's pain only improved from an 8 to a 6.  The weakness is persistent.  Neurology consulted who [facility].  Patient is here with her boyfriend and the patient is reliable, using shared decision making we decided to transfer the patient via private vehicle with IV in place [facility].  I spoke with charge nurse and excepting physician Dr. Wallace CullensGray who will help facilitate MRI.  Patient then transferred.   Additional history obtained: -Additional history obtained from boyfriend -External records from outside source obtained and reviewed including: Chart review including previous notes, labs, imaging, consultation notes   Lab Tests: -I ordered, reviewed, and interpreted labs.   The pertinent results include:   Labs Reviewed  BASIC METABOLIC PANEL - Abnormal; Notable for the following components:      Result Value   Glucose, Bld 123 (*)    All other components within normal limits  CBC - Abnormal; Notable for the following components:   Hemoglobin 11.4 (*)    MCH 25.7 (*)    All other components within normal limits  URINALYSIS, ROUTINE  W REFLEX MICROSCOPIC - Abnormal; Notable for the following components:   Color, Urine COLORLESS (*)    All other components within normal limits  PREGNANCY, URINE      EKG   EKG Interpretation  Date/Time:  Monday October 21 2021 12:58:13 EST Ventricular Rate:  96 PR Interval:  151 QRS Duration: 87 QT Interval:  347 QTC Calculation: 439 R Axis:   93 Text Interpretation: Sinus rhythm Confirmed by Gehrig Patras ([id number]) on 10/21/2021 2:10:48 PM         Imaging Studies ordered: I ordered imaging studies including CT Head I independently visualized and interpreted imaging. I agree with the radiologist interpretation   Medicines ordered and prescription drug management: Meds ordered this encounter  Medications   prochlorperazine (COMPAZINE) injection 10 mg   diphenhydrAMINE (BENADRYL) injection 25 mg   lactated ringers bolus 1,000 mL   DISCONTD: ketorolac (TORADOL) 15 MG/ML injection 15 mg   DISCONTD: dexamethasone (DECADRON) injection 8 mg   DISCONTD: magnesium sulfate IVPB 1 g 100 mL   DISCONTD: ketorolac (TORADOL) 15 MG/ML injection 15 mg    -I have reviewed the patients home medicines and have made adjustments as needed  Critical interventions none  Consultations Obtained: I requested consultation with the neurology,  and discussed lab and imaging findings as well as pertinent plan - they recommend[facility]   Cardiac Monitoring: The patient was maintained on a cardiac monitor.  I personally viewed and interpreted the cardiac monitored which showed an underlying rhythm of: NSR  Social Determinants of Health:  Factors impacting patients care include: none   Reevaluation: After the interventions noted above,  I reevaluated the patient and found that they have :improved  Co morbidities that complicate the patient evaluation History reviewed. No pertinent past medical history.    Dispostion: I considered admission for this patient, and due to  persistent neurologic symptoms, she will be transferred to [facility].  Disposition pending MRI.     Final Clinical Impression(s) / ED Diagnoses Final diagnoses:  Nonintractable headache, unspecified chronicity pattern, unspecified headache type     @PCDICTATION @    , MD 10/21/21 1526

## 2022-09-26 ENCOUNTER — Ambulatory Visit (HOSPITAL_COMMUNITY): Admit: 2022-09-26 | Payer: 59

## 2023-03-30 ENCOUNTER — Encounter: Payer: Self-pay | Admitting: *Deleted

## 2023-03-31 ENCOUNTER — Ambulatory Visit (INDEPENDENT_AMBULATORY_CARE_PROVIDER_SITE_OTHER): Payer: 59 | Admitting: Obstetrics and Gynecology

## 2023-03-31 ENCOUNTER — Encounter: Payer: Self-pay | Admitting: Obstetrics and Gynecology

## 2023-03-31 VITALS — BP 126/81 | HR 77 | Wt 126.0 lb

## 2023-03-31 DIAGNOSIS — F419 Anxiety disorder, unspecified: Secondary | ICD-10-CM | POA: Insufficient documentation

## 2023-03-31 DIAGNOSIS — G43909 Migraine, unspecified, not intractable, without status migrainosus: Secondary | ICD-10-CM

## 2023-03-31 DIAGNOSIS — Z3401 Encounter for supervision of normal first pregnancy, first trimester: Secondary | ICD-10-CM

## 2023-03-31 DIAGNOSIS — N809 Endometriosis, unspecified: Secondary | ICD-10-CM | POA: Diagnosis not present

## 2023-03-31 DIAGNOSIS — Z3A18 18 weeks gestation of pregnancy: Secondary | ICD-10-CM

## 2023-03-31 DIAGNOSIS — Z3490 Encounter for supervision of normal pregnancy, unspecified, unspecified trimester: Secondary | ICD-10-CM | POA: Insufficient documentation

## 2023-03-31 HISTORY — DX: Migraine, unspecified, not intractable, without status migrainosus: G43.909

## 2023-03-31 MED ORDER — METOCLOPRAMIDE HCL 10 MG PO TABS: 10.0000 mg | ORAL_TABLET | Freq: Three times a day (TID) | ORAL | 1 refills | Status: DC

## 2023-03-31 NOTE — Progress Notes (Signed)
History:   Andrea Dyer is a 22 y.o. G1P0 at [redacted]w[redacted]d by LMP being seen today for her first obstetrical visit.  Her obstetrical history is significant for  migraine HA and anxiety. Anxiety is well controlled with Zoloft.  . Patient does intend to breast feed. Pregnancy history fully reviewed.  Patient reports nausea and an increase in HA's unrelieved with Tylenol.   Husband present today for visit.   HISTORY: OB History  Gravida Para Term Preterm AB Living  1 0 0 0 0 0  SAB IAB Ectopic Multiple Live Births  0 0 0 0 0    # Outcome Date GA Lbr Len/2nd Weight Sex Type Anes PTL Lv  1 Current             Last pap smear was NA  Past Medical History:  Diagnosis Date   Anxiety    Endometriosis    Past Surgical History:  Procedure Laterality Date   SEPTOPLASTY     History reviewed. No pertinent family history. Social History   Tobacco Use   Smoking status: Never   Smokeless tobacco: Never  Vaping Use   Vaping status: Never Used  Substance Use Topics   Alcohol use: Never   Drug use: Never   Allergies  Allergen Reactions   Amoxicillin Hives   Penicillins Hives   Current Outpatient Medications on File Prior to Visit  Medication Sig Dispense Refill   sertraline (ZOLOFT) 50 MG tablet Take by mouth.     azithromycin (ZITHROMAX) 250 MG tablet Take two tablets on the first day and then one tablet every day after. (Patient not taking: Reported on 03/31/2023)     No current facility-administered medications on file prior to visit.    Review of Systems Pertinent items noted in HPI and remainder of comprehensive ROS otherwise negative.  Indications for ASA therapy (per UpToDate) One of the following: Previous pregnancy with preeclampsia, especially early onset and with an adverse outcome No Multifetal gestation No Chronic hypertension No Type 1 or 2 diabetes mellitus No Chronic kidney disease No Autoimmune disease (antiphospholipid syndrome, systemic lupus erythematosus)  No Two or more of the following: Nulliparity Yes Obesity (body mass index >30 kg/m2) No Family history of preeclampsia in mother or sister No Age ?35 years No Sociodemographic characteristics (African American race, low socioeconomic level) No Personal risk factors (eg, previous pregnancy with low birth weight or small for gestational age infant, previous adverse pregnancy outcome [eg, stillbirth], interval >10 years between pregnancies) No   Physical Exam:   Vitals:   03/31/23 1420  BP: 126/81  Pulse: 77  Weight: 126 lb (57.2 kg)   Fetal Heart Rate (bpm): 141  Uterine size:     General: well-developed, well-nourished female in no acute distress  Breasts:  Deferred   Skin: normal coloration and turgor, no rashes  Neurologic: oriented, normal, negative, normal mood  Extremities: normal strength, tone, and muscle mass, ROM of all joints is normal  HEENT PERRLA, extraocular movement intact and sclera clear, anicteric  Neck supple and no masses  Cardiovascular: regular rate and rhythm  Respiratory:  no respiratory distress, normal breath sounds  Abdomen: soft, non-tender; bowel sounds normal; no masses,  no organomegaly  Pelvic: Deferred     Assessment:    Pregnancy: G1P0 Patient Active Problem List   Diagnosis Date Noted   Supervision of normal pregnancy 03/31/2023   Migraines 03/31/2023   Endometriosis 03/31/2023   Anxiety 03/31/2023     Plan:  Encounter for supervision of normal pregnancy, antepartum, unspecified gravidity  Declines all genetic screening.   Migraine without status migrainosus, not intractable, unspecified migraine type  Recommend Magnesium 400 mg daily  Rx: Reglan,  ok to include benadryl and tylenol  If symptoms worsen, will referral to neurology.   Endometriosis  Anxiety  Doing well on Zoloft 25 mg daily; discussed small risk of withdrawal symptoms of fetus postpartum,  however no longer term risks associate with SSRI use during  pregnancy.  Discussed home pregnancy can exacerbate mental health conditions.   Initial labs drawn. Continue prenatal vitamins. Problem list reviewed and updated. Genetic Screening discussed,   , Panorama, and Horizon: declined. Ultrasound discussed; fetal anatomic survey: scheduled. Anticipatory guidance about prenatal visits given including labs, ultrasounds, and testing. Weight gain recommendations per IOM guidelines reviewed: underweight/BMI 18.5 or less > 28 - 40 lbs; normal weight/BMI 18.5 - 24.9 > 25 - 35 lbs; overweight/BMI 25 - 29.9 > 15 - 25 lbs; obese/BMI  30 or more > 11 - 20 lbs. Discussed usage of the Babys[employer]s app for more information about pregnancy, and to track blood pressures. Also discussed usage of virtual visits as additional source of managing and completing prenatal visits.  Patient was encouraged to use MyChart to review results, send requests, and have questions addressed.   The nature of Center for [facility] with multiple MDs and Advanced Practice Providers was explained to patient; also emphasized that residents, students are part of our team. Routine obstetric precautions reviewed. Encouraged to seek out care at our office or emergency room [facility] MAU preferred) for urgent and/or emergent concerns. No follow-ups on file.    Sohrab Keelan, Harolyn Rutherford, NP [facility] for [employer], [facility]

## 2023-03-31 NOTE — Progress Notes (Signed)
Pt declined NIPS

## 2023-04-17 ENCOUNTER — Other Ambulatory Visit: Payer: Self-pay | Admitting: *Deleted

## 2023-04-17 ENCOUNTER — Encounter: Payer: Self-pay | Admitting: *Deleted

## 2023-04-17 ENCOUNTER — Ambulatory Visit: Payer: 59 | Admitting: *Deleted

## 2023-04-17 ENCOUNTER — Ambulatory Visit: Payer: 59 | Attending: Obstetrics and Gynecology

## 2023-04-17 VITALS — BP 112/60 | HR 84

## 2023-04-17 DIAGNOSIS — Z3689 Encounter for other specified antenatal screening: Secondary | ICD-10-CM | POA: Diagnosis present

## 2023-04-17 DIAGNOSIS — O321XX0 Maternal care for breech presentation, not applicable or unspecified: Secondary | ICD-10-CM | POA: Diagnosis not present

## 2023-04-17 DIAGNOSIS — Z3490 Encounter for supervision of normal pregnancy, unspecified, unspecified trimester: Secondary | ICD-10-CM | POA: Insufficient documentation

## 2023-04-17 DIAGNOSIS — Z3A18 18 weeks gestation of pregnancy: Secondary | ICD-10-CM | POA: Insufficient documentation

## 2023-04-17 DIAGNOSIS — Z363 Encounter for antenatal screening for malformations: Secondary | ICD-10-CM | POA: Diagnosis not present

## 2023-04-17 DIAGNOSIS — Z3492 Encounter for supervision of normal pregnancy, unspecified, second trimester: Secondary | ICD-10-CM

## 2023-04-22 ENCOUNTER — Inpatient Hospital Stay (HOSPITAL_COMMUNITY)
Admission: AD | Admit: 2023-04-22 | Discharge: 2023-04-23 | Disposition: A | Discharge status: Home / Self Care | Payer: 59 | Attending: Obstetrics and Gynecology | Admitting: Obstetrics and Gynecology

## 2023-04-22 ENCOUNTER — Encounter (HOSPITAL_COMMUNITY): Payer: Self-pay | Admitting: Obstetrics and Gynecology

## 2023-04-22 DIAGNOSIS — O99342 Other mental disorders complicating pregnancy, second trimester: Secondary | ICD-10-CM | POA: Diagnosis present

## 2023-04-22 DIAGNOSIS — O26892 Other specified pregnancy related conditions, second trimester: Secondary | ICD-10-CM | POA: Diagnosis not present

## 2023-04-22 DIAGNOSIS — Z3A18 18 weeks gestation of pregnancy: Secondary | ICD-10-CM | POA: Insufficient documentation

## 2023-04-22 DIAGNOSIS — R112 Nausea with vomiting, unspecified: Secondary | ICD-10-CM

## 2023-04-22 DIAGNOSIS — O219 Vomiting of pregnancy, unspecified: Secondary | ICD-10-CM | POA: Diagnosis present

## 2023-04-22 DIAGNOSIS — R1031 Right lower quadrant pain: Secondary | ICD-10-CM | POA: Diagnosis not present

## 2023-04-22 DIAGNOSIS — O99891 Other specified diseases and conditions complicating pregnancy: Secondary | ICD-10-CM | POA: Diagnosis present

## 2023-04-22 DIAGNOSIS — N1330 Unspecified hydronephrosis: Secondary | ICD-10-CM | POA: Diagnosis not present

## 2023-04-22 DIAGNOSIS — M545 Low back pain, unspecified: Secondary | ICD-10-CM

## 2023-04-22 LAB — CBC
HCT: 30.7 % — ABNORMAL LOW (ref 36.0–46.0)
Hemoglobin: 10.4 g/dL — ABNORMAL LOW (ref 12.0–15.0)
MCH: 29.8 pg (ref 26.0–34.0)
MCHC: 33.9 g/dL (ref 30.0–36.0)
MCV: 88 fL (ref 80.0–100.0)
Platelets: 147 10*3/uL — ABNORMAL LOW (ref 150–400)
RBC: 3.49 MIL/uL — ABNORMAL LOW (ref 3.87–5.11)
RDW: 14.2 % (ref 11.5–15.5)
WBC: 9.1 10*3/uL (ref 4.0–10.5)
nRBC: 0 % (ref 0.0–0.2)

## 2023-04-22 LAB — COMPREHENSIVE METABOLIC PANEL
ALT: 18 U/L (ref 0–44)
AST: 22 U/L (ref 15–41)
Albumin: 2.9 g/dL — ABNORMAL LOW (ref 3.5–5.0)
Alkaline Phosphatase: 35 U/L — ABNORMAL LOW (ref 38–126)
Anion gap: 8 (ref 5–15)
BUN: 8 mg/dL (ref 6–20)
CO2: 22 mmol/L (ref 22–32)
Calcium: 8.7 mg/dL — ABNORMAL LOW (ref 8.9–10.3)
Chloride: 107 mmol/L (ref 98–111)
Creatinine, Ser: 0.68 mg/dL (ref 0.44–1.00)
GFR, Estimated: >60 mL/min (ref >60)
Glucose, Bld: 93 mg/dL (ref 70–99)
Potassium: 3.7 mmol/L (ref 3.5–5.1)
Sodium: 137 mmol/L (ref 135–145)
Total Bilirubin: 0.4 mg/dL (ref 0.3–1.2)
Total Protein: 6 g/dL — ABNORMAL LOW (ref 6.5–8.1)

## 2023-04-22 LAB — URINALYSIS, ROUTINE W REFLEX MICROSCOPIC
Bilirubin Urine: NEGATIVE
Glucose, UA: NEGATIVE mg/dL
Hgb urine dipstick: NEGATIVE
Ketones, ur: NEGATIVE mg/dL
Leukocytes,Ua: NEGATIVE
Nitrite: NEGATIVE
Protein, ur: NEGATIVE mg/dL
Specific Gravity, Urine: 1.008 (ref 1.005–1.030)
pH: 7 (ref 5.0–8.0)

## 2023-04-22 MED ORDER — CYCLOBENZAPRINE HCL 5 MG PO TABS
5.0000 mg | ORAL_TABLET | Freq: Once | ORAL | Status: AC
  Administered 2023-04-22: 5 mg via ORAL
  Filled 2023-04-22: qty 1

## 2023-04-22 MED ORDER — IBUPROFEN 600 MG PO TABS
600.0000 mg | ORAL_TABLET | Freq: Once | ORAL | Status: DC
Start: 2023-04-22 — End: 2023-04-22

## 2023-04-22 MED ORDER — IBUPROFEN 600 MG PO TABS
600.0000 mg | ORAL_TABLET | Freq: Once | ORAL | Status: AC
  Administered 2023-04-22: 600 mg via ORAL
  Filled 2023-04-22: qty 1

## 2023-04-22 MED ORDER — LACTATED RINGERS IV SOLN: Freq: Once | INTRAVENOUS | Status: AC

## 2023-04-22 MED ORDER — ONDANSETRON HCL 4 MG/2ML IJ SOLN
4.0000 mg | Freq: Once | INTRAMUSCULAR | Status: AC
  Administered 2023-04-22: 4 mg via INTRAVENOUS
  Filled 2023-04-22: qty 2

## 2023-04-22 NOTE — MAU Note (Signed)
..  Andrea Dyer is a 22 y.o. at [redacted]w[redacted]d here in MAU reporting: sharp lower back pain that radiates to her abdomen that began a few days ago but today it has been constant. Reports nausea and vomiting that began a few hours ago, has vomited 4 times.   Denies vaginal bleeding. +flutters  Pain score: 8/10 Vitals:   04/22/23 2202  BP: 124/72  Pulse: 87  Resp: 15  Temp: 98.3 F (36.8 C)  SpO2: 100%     FHT:148 Lab orders placed from triage:  UA

## 2023-04-22 NOTE — MAU Provider Note (Signed)
Chief Complaint:  Back Pain, Abdominal Pain, Nausea, and Emesis   Event Date/Time   First Provider Initiated Contact with Patient 04/22/23 2221     HPI: Andrea Dyer is a 22 y.o. G1P0 at 75w5dwho presents to maternity admissions reporting pain in her right lower back which radiates to right lower abdomen/pelvis (low, near groin).  Also has new nausea and vomiting today. No problems with pregnancy thus far.. She denies LOF, vaginal bleeding, vaginal itching/burning, urinary symptoms, h/a, diarrhea, constipation or fever/chills.    Abdominal Pain This is a new problem. The current episode started today. The pain is located in the RLQ. The quality of the pain is described as sharp and cramping. The pain radiates to the back. Associated symptoms include nausea and vomiting. Pertinent negatives include no constipation, diarrhea, dysuria, fever, frequency, headaches or myalgias. Nothing relieves the symptoms. Past treatments include nothing.   RN Note: Andrea Dyer is a 22 y.o. at [redacted]w[redacted]d here in MAU reporting: sharp lower back pain that radiates to her abdomen that began a few days ago but today it has been constant. Reports nausea and vomiting that began a few hours ago, has vomited 4 times.   Denies vaginal bleeding. +flutters                              Pain score: 8/10  Past Medical History: Past Medical History:  Diagnosis Date   Anxiety    Endometriosis     Past obstetric history: OB History  Gravida Para Term Preterm AB Living  1            SAB IAB Ectopic Multiple Live Births               # Outcome Date GA Lbr Len/2nd Weight Sex Type Anes PTL Lv  1 Current             Past Surgical History: Past Surgical History:  Procedure Laterality Date   LAPAROSCOPY     SEPTOPLASTY      Family History: Family History  Problem Relation Age of Onset   Diabetes Neg Hx    Heart disease Neg Hx     Social History: Social History   Tobacco Use   Smoking status: Never    Smokeless tobacco: Never  Vaping Use   Vaping status: Never Used  Substance Use Topics   Alcohol use: Never   Drug use: Never    Allergies:  Allergies  Allergen Reactions   Amoxicillin Hives   Penicillins Hives    Meds:  Medications Prior to Admission  Medication Sig Dispense Refill Last Dose   Prenatal Vit-Fe Fumarate-FA (PRENATAL MULTIVITAMIN) TABS tablet Take 1 tablet by mouth daily at 12 noon.   04/22/2023   sertraline (ZOLOFT) 50 MG tablet Take by mouth.   04/22/2023   azithromycin (ZITHROMAX) 250 MG tablet Take two tablets on the first day and then one tablet every day after. (Patient not taking: Reported on 03/31/2023)      metoCLOPramide (REGLAN) 10 MG tablet Take 1 tablet (10 mg total) by mouth 4 (four) times daily -  before meals and at bedtime. (Patient not taking: Reported on 04/17/2023) 90 tablet 1     I have reviewed patient's Past Medical Hx, Surgical Hx, Family Hx, Social Hx, medications and allergies.   ROS:  Review of Systems  Constitutional:  Negative for fever.  Gastrointestinal:  Positive for abdominal pain, nausea and vomiting. Negative  for constipation and diarrhea.  Genitourinary:  Negative for dysuria and frequency.  Musculoskeletal:  Negative for myalgias.  Neurological:  Negative for headaches.   Other systems negative  Physical Exam  Patient Vitals for the past 24 hrs:  BP Temp Temp src Pulse Resp SpO2 Height Weight  04/22/23 2202 124/72 98.3 F (36.8 C) Oral 87 15 100 % 5\' 6"  (1.676 m) 60.3 kg   Constitutional: Well-developed, well-nourished female in no acute distress.  Cardiovascular: normal rate and rhythm Respiratory: normal effort, clear to auscultation bilaterally GI: Abd soft, tender over lower right abdomen, gravid appropriate for gestational age.   No rebound or guarding. MS: Extremities nontender, no edema, normal ROM Neurologic: Alert and oriented x 4.  GU: Neg CVAT.  PELVIC EXAM: Dilation: Closed Effacement (%): Thick Cervical  Position: Posterior Exam by:: Wynelle Bourgeois, CNM    FHT:  148   Labs: . Results for orders placed or performed during the hospital encounter of 04/22/23 (from the past 24 hour(s))  Urinalysis, Routine w reflex microscopic -Urine, Clean Catch     Status: Abnormal   Collection Time: 04/22/23 10:29 PM  Result Value Ref Range   Color, Urine YELLOW YELLOW   APPearance HAZY (A) CLEAR   Specific Gravity, Urine 1.008 1.005 - 1.030   pH 7.0 5.0 - 8.0   Glucose, UA NEGATIVE NEGATIVE mg/dL   Hgb urine dipstick NEGATIVE NEGATIVE   Bilirubin Urine NEGATIVE NEGATIVE   Ketones, ur NEGATIVE NEGATIVE mg/dL   Protein, ur NEGATIVE NEGATIVE mg/dL   Nitrite NEGATIVE NEGATIVE   Leukocytes,Ua NEGATIVE NEGATIVE  CBC     Status: Abnormal   Collection Time: 04/22/23 10:32 PM  Result Value Ref Range   WBC 9.1 4.0 - 10.5 K/uL   RBC 3.49 (L) 3.87 - 5.11 MIL/uL   Hemoglobin 10.4 (L) 12.0 - 15.0 g/dL   HCT 95.6 (L) 38.7 - 56.4 %   MCV 88.0 80.0 - 100.0 fL   MCH 29.8 26.0 - 34.0 pg   MCHC 33.9 30.0 - 36.0 g/dL   RDW 33.2 95.1 - 88.4 %   Platelets 147 (L) 150 - 400 K/uL   nRBC 0.0 0.0 - 0.2 %  Comprehensive metabolic panel     Status: Abnormal   Collection Time: 04/22/23 10:32 PM  Result Value Ref Range   Sodium 137 135 - 145 mmol/L   Potassium 3.7 3.5 - 5.1 mmol/L   Chloride 107 98 - 111 mmol/L   CO2 22 22 - 32 mmol/L   Glucose, Bld 93 70 - 99 mg/dL   BUN 8 6 - 20 mg/dL   Creatinine, Ser 1.66 0.44 - 1.00 mg/dL   Calcium 8.7 (L) 8.9 - 10.3 mg/dL   Total Protein 6.0 (L) 6.5 - 8.1 g/dL   Albumin 2.9 (L) 3.5 - 5.0 g/dL   AST 22 15 - 41 U/L   ALT 18 0 - 44 U/L   Alkaline Phosphatase 35 (L) 38 - 126 U/L   Total Bilirubin 0.4 0.3 - 1.2 mg/dL   GFR, Estimated >06 >30 mL/min   Anion gap 8 5 - 15    A/Positive/-- (08/06 1514)  Imaging:  US RENAL  Result Date: 04/23/2023 CLINICAL DATA:  Abdominal pain, pregnant. EXAM: RENAL / URINARY TRACT ULTRASOUND COMPLETE COMPARISON:  None Available.  FINDINGS: Right Kidney: Renal measurements: 11.1 x 5.8 x 6.2 cm = volume: 211 mL. Echogenicity within normal limits. There is moderate hydronephrosis. No focal mass. Left Kidney: Renal measurements: 12.1 x 4.4 x  4.5 cm = volume: 123 mL. Echogenicity within normal limits. There is mild hydronephrosis. No focal mass. Bladder: Appears normal for degree of bladder distention. Other: None. IMPRESSION: Moderate right and mild left hydronephrosis. Electronically Signed   By: Darliss Cheney M.D.   On: 04/23/2023 00:56     MAU Course/MDM: I have reviewed the triage vital signs and the nursing notes.   Pertinent labs & imaging results that were available during my care of the patient were reviewed by me and considered in my medical decision making (see chart for details).      I have reviewed her medical records including past results, notes and treatments.   I have ordered labs and reviewed results. No leukocytosis to suggest appendicitis.  Also exam does not demonstrate rebound or guarding. US showed mod hydronephrosis on right and mild hydro on left.  This may reflect a renal stone.  Treatments in MAU included We started out with ibuprofen in case the pain was related to uterine cramping.  There was no relief from this. We then gave a dose of Flexeril to see if back spasm might have been responsible but there was not much relief from this either.  We then ordered a renal US and found bilateral hydro.  Discussed this may be a stone. Offered Dilaudid but by this time pain had diminished significantly  Discussed treatment involves fluids, Flomax and pain meds with Urology referral .    Assessment: Single iUP at [redacted]w[redacted]d Right lower abdominal and back pain Right hydronephrosis, moderate Left hydronephrosis, mild Nausea and vomiting  Plan: Discharge home Rx Percocet for pain Rx Flomax for possible kidney stone Message sent for Urology referral   Labor precautions and fetal kick counts Follow up in  Office for prenatal visits and recheck Encouraged to return if she develops worsening of symptoms, increase in pain, fever, or other concerning symptoms.   Pt stable at time of discharge.  Wynelle Bourgeois CNM, MSN Certified Nurse-Midwife 04/22/2023 10:21 PM

## 2023-04-23 ENCOUNTER — Inpatient Hospital Stay (HOSPITAL_COMMUNITY): Payer: 59

## 2023-04-23 DIAGNOSIS — M545 Low back pain, unspecified: Secondary | ICD-10-CM | POA: Diagnosis not present

## 2023-04-23 DIAGNOSIS — Z3A18 18 weeks gestation of pregnancy: Secondary | ICD-10-CM | POA: Diagnosis not present

## 2023-04-23 DIAGNOSIS — R1031 Right lower quadrant pain: Secondary | ICD-10-CM | POA: Diagnosis not present

## 2023-04-23 DIAGNOSIS — N1330 Unspecified hydronephrosis: Secondary | ICD-10-CM | POA: Insufficient documentation

## 2023-04-23 DIAGNOSIS — R112 Nausea with vomiting, unspecified: Secondary | ICD-10-CM | POA: Diagnosis not present

## 2023-04-23 DIAGNOSIS — O99891 Other specified diseases and conditions complicating pregnancy: Secondary | ICD-10-CM | POA: Diagnosis not present

## 2023-04-23 MED ORDER — HYDROMORPHONE HCL 1 MG/ML IJ SOLN: 0.5000 mg | Freq: Once | INTRAMUSCULAR | Status: DC

## 2023-04-23 MED ORDER — TAMSULOSIN HCL 0.4 MG PO CAPS: 0.4000 mg | ORAL_CAPSULE | Freq: Every day | ORAL | 0 refills | Status: DC

## 2023-04-23 MED ORDER — OXYCODONE-ACETAMINOPHEN 5-325 MG PO TABS: 1.0000 | ORAL_TABLET | Freq: Four times a day (QID) | ORAL | 0 refills | Status: DC | PRN

## 2023-04-29 ENCOUNTER — Ambulatory Visit (INDEPENDENT_AMBULATORY_CARE_PROVIDER_SITE_OTHER): Payer: 59 | Admitting: Obstetrics and Gynecology

## 2023-04-29 ENCOUNTER — Encounter: Payer: Self-pay | Admitting: Obstetrics and Gynecology

## 2023-04-29 VITALS — BP 134/84 | HR 97 | Wt 134.0 lb

## 2023-04-29 DIAGNOSIS — O99012 Anemia complicating pregnancy, second trimester: Secondary | ICD-10-CM

## 2023-04-29 DIAGNOSIS — Z3490 Encounter for supervision of normal pregnancy, unspecified, unspecified trimester: Secondary | ICD-10-CM

## 2023-04-29 DIAGNOSIS — N1330 Unspecified hydronephrosis: Secondary | ICD-10-CM

## 2023-04-29 MED ORDER — FERROUS SULFATE 325 (65 FE) MG PO TBEC
325.0000 mg | DELAYED_RELEASE_TABLET | ORAL | 3 refills | Status: DC
Start: 2023-04-29 — End: 2023-07-14

## 2023-04-29 NOTE — Progress Notes (Signed)
Pt declined flu shot °

## 2023-04-29 NOTE — Progress Notes (Signed)
   PRENATAL VISIT NOTE  Subjective:  Andrea Dyer is a 22 y.o. G1P0 at [redacted]w[redacted]d being seen today for ongoing prenatal care.  She is currently monitored for the following issues for this low-risk pregnancy and has Supervision of normal pregnancy; Migraines; Endometriosis; Anxiety; and Bilateral hydronephrosis on their problem list.  Patient reports backache.  Contractions: Not present. Vag. Bleeding: None.  Movement: Present. Denies leaking of fluid.   The following portions of the patient's history were reviewed and updated as appropriate: allergies, current medications, past family history, past medical history, past social history, past surgical history and problem list.   Objective:   Vitals:   04/29/23 1306 04/29/23 1309 04/29/23 1325  BP: (!) 146/78 (!) 147/85 134/84  Pulse: 84  97  Weight: 134 lb (60.8 kg)      Fetal Status: Fetal Heart Rate (bpm): 131   Movement: Present     General:  Alert, oriented and cooperative. Patient is in no acute distress.  Skin: Skin is warm and dry. No rash noted.   Cardiovascular: Normal heart rate noted  Respiratory: Normal respiratory effort, no problems with respiration noted  Abdomen: Soft, gravid, appropriate for gestational age.  Pain/Pressure: Absent     Pelvic: Cervical exam deferred        Extremities: Normal range of motion.  Edema: None  Mental Status: Normal mood and affect. Normal behavior. Normal judgment and thought content.   Assessment and Plan:  Pregnancy: G1P0 at [redacted]w[redacted]d 1. Encounter for supervision of normal pregnancy, antepartum, unspecified gravidity Anatomy US normal but incomplete. Has f/u scheduled on 9/20. EDC also changed from this [place] due to large discrepancy (18d) AFP given Recommended flu shot - pt declined. BP elevated today for systolic. Recheck unchanged - checked once more and normal.  Anemia of pregnancy noted. Iron started. Platelets 142 - recheck with 28 wk labs.   2. Bilateral hydronephrosis Seen in MAU.  Referral placed to urology and has appt on 9/9. Continue flomax and oxycodone for now. Discussed rare to need nephrostomy tube for stones, but would be based on hydro. Creatinine normal as expected.   Preterm labor symptoms and general obstetric precautions including but not limited to vaginal bleeding, contractions, leaking of fluid and fetal movement were reviewed in detail with the patient. Please refer to After Visit Summary for other counseling recommendations.   Return in about 4 weeks (around 05/27/2023) for OB VISIT, MD or APP.  Future Appointments  Date Time Provider Department Center  05/04/2023  2:30 PM Milderd Meager., MD [facility] None  05/15/2023 10:30 AM [facility] US5 [facility]  06/01/2023  1:10 PM Lesly Dukes, MD [facility]    Milas Hock, MD

## 2023-05-01 LAB — AFP, SERUM, OPEN SPINA BIFIDA
AFP MoM: 0.98
AFP Value: 57.8 ng/mL
Gest. Age on Collection Date: 19.7 wk
Maternal Age At EDD: 21 yr
OSBR Risk 1 IN: 10000
Test Results:: NEGATIVE
Weight: 130 [lb_av]

## 2023-05-04 ENCOUNTER — Ambulatory Visit (INDEPENDENT_AMBULATORY_CARE_PROVIDER_SITE_OTHER): Payer: 59 | Admitting: Urology

## 2023-05-04 ENCOUNTER — Encounter: Payer: Self-pay | Admitting: Urology

## 2023-05-04 VITALS — BP 116/74 | HR 82 | Ht 66.0 in | Wt 130.0 lb

## 2023-05-04 DIAGNOSIS — N1330 Unspecified hydronephrosis: Secondary | ICD-10-CM

## 2023-05-04 DIAGNOSIS — O99891 Other specified diseases and conditions complicating pregnancy: Secondary | ICD-10-CM

## 2023-05-04 DIAGNOSIS — Z3A20 20 weeks gestation of pregnancy: Secondary | ICD-10-CM | POA: Diagnosis not present

## 2023-05-04 LAB — URINALYSIS, ROUTINE W REFLEX MICROSCOPIC
Bilirubin, UA: NEGATIVE
Glucose, UA: NEGATIVE
Ketones, UA: NEGATIVE
Leukocytes,UA: NEGATIVE
Nitrite, UA: NEGATIVE
Protein,UA: NEGATIVE
RBC, UA: NEGATIVE
Specific Gravity, UA: 1.015 (ref 1.005–1.030)
Urobilinogen, Ur: 0.2 mg/dL (ref 0.2–1.0)
pH, UA: 6.5 (ref 5.0–7.5)

## 2023-05-04 MED ORDER — OXYCODONE-ACETAMINOPHEN 5-325 MG PO TABS
1.0000 | ORAL_TABLET | Freq: Four times a day (QID) | ORAL | 0 refills | Status: DC | PRN
Start: 2023-05-04 — End: 2023-06-01

## 2023-05-04 NOTE — Progress Notes (Signed)
Assessment: 1. Bilateral hydronephrosis   2. [redacted] weeks gestation of pregnancy     Plan: I personally reviewed the patient's chart including provider notes, lab and imaging results. I personally reviewed the ultrasound study from 04/23/2023 with results as noted below.  There is evidence of moderate right-sided hydronephrosis and mild left hydronephrosis.  No images of the bladder obtained to allow for evaluation of efflux from the ureters. I discussed the diagnosis of hydronephrosis during pregnancy and the possible causes including hydronephrosis of pregnancy as well as possible ureteral calculi.  Management options including conservative management with pain medication, surgical management with ureteral stent placement, and percutaneous nephrostomy tube placement were discussed with the patient, her husband, and her mother today.  I discussed that conservative management is generally recommended initially.  I also discussed the desire to avoid imaging studies requiring radiation exposure to the baby.  If she is unable to control her pain or develops persistent vomiting, additional evaluation would be indicated with possible intervention necessary.  She expressed understanding and agrees to proceed with conservative management. I do not see any need for tamsulosin at the present time. Refill of pain medication provided Strain urine Return to office in 3 weeks   Chief Complaint:  Chief Complaint  Patient presents with   Hydronephrosis    History of Present Illness:  Andrea Dyer is a 22 y.o. female who is seen in consultation from Rasch, Victorino Dike I, NP for evaluation of bilateral hydronephrosis.  She is currently [redacted] weeks pregnant.  This is her first pregnancy.  She had onset of right sided back and flank pain approximately 2 weeks ago.  She has had associated nausea and vomiting.  No fevers or chills.  She has urinary frequency but no dysuria or gross hematuria.  She was evaluated with  a ultrasound on 04/23/2023 which showed moderate right and mild left hydronephrosis.  No obvious stones were seen.  She was given a prescription for pain medicine and tamsulosin for a presumed kidney stone. No prior history of nephrolithiasis.  She has been taking pain medication 1-2 times per day for management of her symptoms.  She is tolerating liquids and a diet at the present time.   Past Medical History:  Past Medical History:  Diagnosis Date   Anxiety    Endometriosis     Past Surgical History:  Past Surgical History:  Procedure Laterality Date   LAPAROSCOPY     SEPTOPLASTY      Allergies:  Allergies  Allergen Reactions   Amoxicillin Hives   Penicillins Hives    Family History:  Family History  Problem Relation Age of Onset   Diabetes Neg Hx    Heart disease Neg Hx     Social History:  Social History   Tobacco Use   Smoking status: Never   Smokeless tobacco: Never  Vaping Use   Vaping status: Never Used  Substance Use Topics   Alcohol use: Never   Drug use: Never    Review of symptoms:  Constitutional:  Negative for unexplained weight loss, night sweats, fever, chills ENT:  Negative for nose bleeds, sinus pain, painful swallowing CV:  Negative for chest pain, shortness of breath, exercise intolerance, palpitations, loss of consciousness Resp:  Negative for cough, wheezing, shortness of breath GI:  Negative for diarrhea, bloody stools GU:  Positives noted in HPI; otherwise negative for gross hematuria, dysuria, urinary incontinence Neuro:  Negative for seizures, poor balance, limb weakness, slurred speech Psych:  Negative for lack  of energy, depression, anxiety Endocrine:  Negative for polydipsia, polyuria, symptoms of hypoglycemia (dizziness, hunger, sweating) Hematologic:  Negative for anemia, purpura, petechia, prolonged or excessive bleeding, use of anticoagulants  Allergic:  Negative for difficulty breathing or choking as a result of exposure to  anything; no shellfish allergy; no allergic response (rash/itch) to materials, foods  Physical exam: BP 116/74   Pulse 82   Ht 5\' 6"  (1.676 m)   Wt 130 lb (59 kg)   LMP 11/24/2022   BMI 20.98 kg/m  GENERAL APPEARANCE:  Well appearing, well developed, well nourished, NAD HEENT: Atraumatic, Normocephalic, oropharynx clear. NECK: Supple without lymphadenopathy or thyromegaly. LUNGS: Clear to auscultation bilaterally. HEART: Regular Rate and Rhythm without murmurs, gallops, or rubs. ABDOMEN: Soft, non-tender, No Masses. EXTREMITIES: Moves all extremities well.  Without clubbing, cyanosis, or edema. NEUROLOGIC:  Alert and oriented x 3, normal gait, CN II-XII grossly intact.  MENTAL STATUS:  Appropriate. BACK:  Non-tender to palpation.  No CVAT SKIN:  Warm, dry and intact.    Results: Results for orders placed or performed in visit on 05/04/23 (from the past 24 hour(s))  Urinalysis, Routine w reflex microscopic   Collection Time: 05/04/23  2:24 PM  Result Value Ref Range   Specific Gravity, UA 1.015 1.005 - 1.030   pH, UA 6.5 5.0 - 7.5   Color, UA Yellow Yellow   Appearance Ur Clear Clear   Leukocytes,UA Negative Negative   Protein,UA Negative Negative/Trace   Glucose, UA Negative Negative   Ketones, UA Negative Negative   RBC, UA Negative Negative   Bilirubin, UA Negative Negative   Urobilinogen, Ur 0.2 0.2 - 1.0 mg/dL   Nitrite, UA Negative Negative

## 2023-05-13 ENCOUNTER — Encounter: Payer: Self-pay | Admitting: *Deleted

## 2023-05-15 ENCOUNTER — Other Ambulatory Visit: Payer: Self-pay

## 2023-05-15 ENCOUNTER — Ambulatory Visit: Payer: 59 | Attending: Obstetrics

## 2023-05-15 DIAGNOSIS — O321XX0 Maternal care for breech presentation, not applicable or unspecified: Secondary | ICD-10-CM | POA: Insufficient documentation

## 2023-05-15 DIAGNOSIS — Z3A22 22 weeks gestation of pregnancy: Secondary | ICD-10-CM | POA: Insufficient documentation

## 2023-05-15 DIAGNOSIS — O358XX0 Maternal care for other (suspected) fetal abnormality and damage, not applicable or unspecified: Secondary | ICD-10-CM

## 2023-05-15 DIAGNOSIS — Z3492 Encounter for supervision of normal pregnancy, unspecified, second trimester: Secondary | ICD-10-CM | POA: Insufficient documentation

## 2023-05-15 DIAGNOSIS — Z362 Encounter for other antenatal screening follow-up: Secondary | ICD-10-CM

## 2023-05-28 ENCOUNTER — Ambulatory Visit: Payer: 59 | Admitting: Urology

## 2023-05-28 NOTE — Progress Notes (Deleted)
   Assessment: 1. Bilateral hydronephrosis   2. [redacted] weeks gestation of pregnancy      Plan: I personally reviewed the ultrasound study from 04/23/2023 with results as noted below.  There is evidence of moderate right-sided hydronephrosis and mild left hydronephrosis.  No images of the bladder obtained to allow for evaluation of efflux from the ureters. I discussed the diagnosis of hydronephrosis during pregnancy and the possible causes including hydronephrosis of pregnancy as well as possible ureteral calculi.  Management options including conservative management with pain medication, surgical management with ureteral stent placement, and percutaneous nephrostomy tube placement were discussed with the patient, her husband, and her mother today.  I discussed that conservative management is generally recommended initially.  I also discussed the desire to avoid imaging studies requiring radiation exposure to the baby.  If she is unable to control her pain or develops persistent vomiting, additional evaluation would be indicated with possible intervention necessary.  She expressed understanding and agrees to proceed with conservative management. Refill of pain medication provided Strain urine Return to office in 3 weeks   Chief Complaint:  No chief complaint on file.   History of Present Illness:  Andrea Dyer is a 22 y.o. female who is seen for further evaluation of bilateral hydronephrosis.  She is currently [redacted] weeks pregnant with her first pregnancy.  She had onset of right sided back and flank pain in late August 2024.  She had associated nausea and vomiting.  No fevers or chills.  She reported urinary frequency but no dysuria or gross hematuria.  She was evaluated with a ultrasound on 04/23/2023 which showed moderate right and mild left hydronephrosis.  No obvious stones were seen.  She was given a prescription for pain medicine and tamsulosin for a presumed kidney stone. No prior history of  nephrolithiasis.  She has been taking pain medication 1-2 times per day for management of her symptoms.    Portions of the above documentation were copied from a prior visit for review purposes only.   Past Medical History:  Past Medical History:  Diagnosis Date   Anxiety    Endometriosis    Migraines 03/31/2023    Past Surgical History:  Past Surgical History:  Procedure Laterality Date   LAPAROSCOPY     SEPTOPLASTY      Allergies:  Allergies  Allergen Reactions   Amoxicillin Hives   Penicillins Hives    Family History:  Family History  Problem Relation Age of Onset   Diabetes Neg Hx    Heart disease Neg Hx     Social History:  Social History   Tobacco Use   Smoking status: Never   Smokeless tobacco: Never  Vaping Use   Vaping status: Never Used  Substance Use Topics   Alcohol use: Never   Drug use: Never     Physical exam: LMP 11/24/2022  GENERAL APPEARANCE:  Well appearing, well developed, well nourished, NAD HEENT:  Atraumatic, normocephalic, oropharynx clear NECK:  Supple without lymphadenopathy or thyromegaly ABDOMEN:  Soft, non-tender, no masses EXTREMITIES:  Moves all extremities well, without clubbing, cyanosis, or edema NEUROLOGIC:  Alert and oriented x 3, normal gait, CN II-XII grossly intact MENTAL STATUS:  appropriate BACK:  Non-tender to palpation, No CVAT SKIN:  Warm, dry, and intact  Results: U/A:

## 2023-06-01 ENCOUNTER — Ambulatory Visit (INDEPENDENT_AMBULATORY_CARE_PROVIDER_SITE_OTHER): Payer: 59 | Admitting: Obstetrics & Gynecology

## 2023-06-01 VITALS — BP 113/70 | HR 86 | Wt 139.0 lb

## 2023-06-01 DIAGNOSIS — Z3490 Encounter for supervision of normal pregnancy, unspecified, unspecified trimester: Secondary | ICD-10-CM

## 2023-06-01 DIAGNOSIS — O219 Vomiting of pregnancy, unspecified: Secondary | ICD-10-CM

## 2023-06-01 DIAGNOSIS — N1330 Unspecified hydronephrosis: Secondary | ICD-10-CM

## 2023-06-01 NOTE — Progress Notes (Signed)
   PRENATAL VISIT NOTE  Subjective:  Andrea Dyer is a 22 y.o. G1P0 at [redacted]w[redacted]d being seen today for ongoing prenatal care.  She is currently monitored for the following issues for this low-risk pregnancy and has Supervision of normal pregnancy; Endometriosis; Anxiety; and Bilateral hydronephrosis on their problem list.  Patient reports nausea.  Contractions: Not present. Vag. Bleeding: None.  Movement: Present. Denies leaking of fluid.   The following portions of the patient's history were reviewed and updated as appropriate: allergies, current medications, past family history, past medical history, past social history, past surgical history and problem list.   Objective:   Vitals:   06/01/23 1314  BP: 113/70  Pulse: 86  Weight: 139 lb (63 kg)    Fetal Status: Fetal Heart Rate (bpm): 137   Movement: Present     General:  Alert, oriented and cooperative. Patient is in no acute distress.  Skin: Skin is warm and dry. No rash noted.   Cardiovascular: Normal heart rate noted  Respiratory: Normal respiratory effort, no problems with respiration noted  Abdomen: Soft, gravid, appropriate for gestational age.  Pain/Pressure: Absent     Pelvic: Cervical exam deferred        Extremities: Normal range of motion.  Edema: None  Mental Status: Normal mood and affect. Normal behavior. Normal judgment and thought content.   Assessment and Plan:  Pregnancy: G1P0 at [redacted]w[redacted]d 1. Bilateral hydronephrosis No complaints, not taking flomax.  2. Encounter for supervision of normal pregnancy, antepartum, unspecified gravidity Mild nausea and heart burn--takes tums--can move to pepcid if necessary.  Has Reglan which can also help.   Preterm labor symptoms and general obstetric precautions including but not limited to vaginal bleeding, contractions, leaking of fluid and fetal movement were reviewed in detail with the patient. Please refer to After Visit Summary for other counseling recommendations.   No  follow-ups on file.  Future Appointments  Date Time Provider Department Center  06/19/2023  2:30 PM [facility] US[facility]    Elsie Lincoln, MD

## 2023-06-18 ENCOUNTER — Telehealth: Payer: Self-pay

## 2023-06-18 NOTE — Telephone Encounter (Signed)
Called patient to try to move up to 7:30am - patient not available and mailbox is full

## 2023-06-19 ENCOUNTER — Other Ambulatory Visit: Payer: Self-pay

## 2023-06-19 ENCOUNTER — Ambulatory Visit: Payer: 59 | Attending: Maternal & Fetal Medicine

## 2023-06-19 DIAGNOSIS — O358XX0 Maternal care for other (suspected) fetal abnormality and damage, not applicable or unspecified: Secondary | ICD-10-CM | POA: Diagnosis not present

## 2023-06-19 DIAGNOSIS — Z3A27 27 weeks gestation of pregnancy: Secondary | ICD-10-CM | POA: Diagnosis not present

## 2023-06-19 DIAGNOSIS — Z362 Encounter for other antenatal screening follow-up: Secondary | ICD-10-CM | POA: Insufficient documentation

## 2023-06-26 ENCOUNTER — Ambulatory Visit (INDEPENDENT_AMBULATORY_CARE_PROVIDER_SITE_OTHER): Payer: 59 | Admitting: Obstetrics and Gynecology

## 2023-06-26 VITALS — BP 123/79 | HR 71 | Wt 144.0 lb

## 2023-06-26 DIAGNOSIS — Z1339 Encounter for screening examination for other mental health and behavioral disorders: Secondary | ICD-10-CM | POA: Diagnosis not present

## 2023-06-26 DIAGNOSIS — Z3490 Encounter for supervision of normal pregnancy, unspecified, unspecified trimester: Secondary | ICD-10-CM

## 2023-06-26 NOTE — Progress Notes (Signed)
   PRENATAL VISIT NOTE  Subjective:  Andrea Dyer is a 22 y.o. G1P0 at [redacted]w[redacted]d being seen today for ongoing prenatal care.  She is currently monitored for the following issues for this low-risk pregnancy and has Supervision of normal pregnancy; Endometriosis; Anxiety; and Bilateral hydronephrosis on their problem list.  Patient reports no complaints.  Contractions: Not present. Vag. Bleeding: None.  Movement: Present. Denies leaking of fluid.   The following portions of the patient's history were reviewed and updated as appropriate: allergies, current medications, past family history, past medical history, past social history, past surgical history and problem list.   Objective:   Vitals:   06/26/23 0826  BP: 123/79  Pulse: 71  Weight: 144 lb (65.3 kg)    Fetal Status: Fetal Heart Rate (bpm): 145 Fundal Height: 28 cm Movement: Present     General:  Alert, oriented and cooperative. Patient is in no acute distress.  Skin: Skin is warm and dry. No rash noted.   Cardiovascular: Normal heart rate noted  Respiratory: Normal respiratory effort, no problems with respiration noted  Abdomen: Soft, gravid, appropriate for gestational age.  Pain/Pressure: Absent     Pelvic: Cervical exam deferred        Extremities: Normal range of motion.  Edema: None  Mental Status: Normal mood and affect. Normal behavior. Normal judgment and thought content.   Assessment and Plan:  Pregnancy: G1P0 at [redacted]w[redacted]d 1. Encounter for supervision of normal pregnancy, antepartum, unspecified gravidity  - Bp good today, will continue to monitor.  - Glucose Tolerance, 2 Hours w/1 Hour - HIV antibody (with reflex) - CBC - RPR  Preterm labor symptoms and general obstetric precautions including but not limited to vaginal bleeding, contractions, leaking of fluid and fetal movement were reviewed in detail with the patient. Please refer to After Visit Summary for other counseling recommendations.   No follow-ups on  file.  No future appointments.  Venia Carbon, NP

## 2023-06-27 LAB — CBC
Hematocrit: 34.7 % (ref 34.0–46.6)
Hemoglobin: 11.8 g/dL (ref 11.1–15.9)
MCH: 32.1 pg (ref 26.6–33.0)
MCHC: 34 g/dL (ref 31.5–35.7)
MCV: 94 fL (ref 79–97)
Platelets: 165 10*3/uL (ref 150–450)
RBC: 3.68 x10E6/uL — ABNORMAL LOW (ref 3.77–5.28)
RDW: 13.6 % (ref 11.7–15.4)
WBC: 10 10*3/uL (ref 3.4–10.8)

## 2023-06-27 LAB — GLUCOSE TOLERANCE, 2 HOURS W/ 1HR
Glucose, 1 hour: 119 mg/dL (ref 70–179)
Glucose, 2 hour: 114 mg/dL (ref 70–152)
Glucose, Fasting: 77 mg/dL (ref 70–91)

## 2023-06-27 LAB — HIV ANTIBODY (ROUTINE TESTING W REFLEX): HIV Screen 4th Generation wRfx: NONREACTIVE

## 2023-06-27 LAB — RPR: RPR Ser Ql: NONREACTIVE

## 2023-07-01 ENCOUNTER — Other Ambulatory Visit: Payer: Self-pay | Admitting: *Deleted

## 2023-07-01 MED ORDER — ONDANSETRON HCL 8 MG PO TABS: 8.0000 mg | ORAL_TABLET | Freq: Three times a day (TID) | ORAL | 0 refills | Status: DC | PRN

## 2023-07-08 ENCOUNTER — Other Ambulatory Visit: Payer: Self-pay

## 2023-07-08 MED ORDER — SERTRALINE HCL 50 MG PO TABS: 50.0000 mg | ORAL_TABLET | Freq: Every day | ORAL | 1 refills | Status: DC

## 2023-07-08 NOTE — Progress Notes (Signed)
Refill of Zoloft sent per Dr.Duncan. Pt has an appt with Psychiatrist in 3 weeks.

## 2023-07-10 ENCOUNTER — Other Ambulatory Visit: Payer: Self-pay | Admitting: Obstetrics and Gynecology

## 2023-07-10 ENCOUNTER — Ambulatory Visit: Payer: 59 | Admitting: Obstetrics and Gynecology

## 2023-07-10 VITALS — BP 137/82 | HR 91 | Wt 147.0 lb

## 2023-07-10 DIAGNOSIS — O3413 Maternal care for benign tumor of corpus uteri, third trimester: Secondary | ICD-10-CM

## 2023-07-10 DIAGNOSIS — N8000 Endometriosis of the uterus, unspecified: Secondary | ICD-10-CM

## 2023-07-10 DIAGNOSIS — Z3490 Encounter for supervision of normal pregnancy, unspecified, unspecified trimester: Secondary | ICD-10-CM

## 2023-07-10 DIAGNOSIS — O26833 Pregnancy related renal disease, third trimester: Secondary | ICD-10-CM

## 2023-07-10 DIAGNOSIS — F419 Anxiety disorder, unspecified: Secondary | ICD-10-CM

## 2023-07-10 DIAGNOSIS — Z3A30 30 weeks gestation of pregnancy: Secondary | ICD-10-CM

## 2023-07-10 DIAGNOSIS — Z1331 Encounter for screening for depression: Secondary | ICD-10-CM | POA: Diagnosis not present

## 2023-07-10 DIAGNOSIS — O99343 Other mental disorders complicating pregnancy, third trimester: Secondary | ICD-10-CM

## 2023-07-10 DIAGNOSIS — N1330 Unspecified hydronephrosis: Secondary | ICD-10-CM

## 2023-07-10 NOTE — Progress Notes (Signed)
   PRENATAL VISIT NOTE  Subjective:  Andrea Dyer is a 22 y.o. G1P0 at [redacted]w[redacted]d being seen today for ongoing prenatal care.  She is currently monitored for the following issues for this low-risk pregnancy and has Supervision of normal pregnancy; Endometriosis; Anxiety; and Bilateral hydronephrosis on their problem list.  Patient reports no complaints.  Contractions: Not present. Vag. Bleeding: None.  Movement: Present. Denies leaking of fluid.   The following portions of the patient's history were reviewed and updated as appropriate: allergies, current medications, past family history, past medical history, past social history, past surgical history and problem list.   Objective:   Vitals:   07/10/23 1032  BP: 137/82  Pulse: 91  Weight: 147 lb (66.7 kg)    Fetal Status: Fetal Heart Rate (bpm): 134 Fundal Height: 30 cm Movement: Present     General:  Alert, oriented and cooperative. Patient is in no acute distress.  Skin: Skin is warm and dry. No rash noted.   Cardiovascular: Normal heart rate noted  Respiratory: Normal respiratory effort, no problems with respiration noted  Abdomen: Soft, gravid, appropriate for gestational age.  Pain/Pressure: Absent     Pelvic: Cervical exam deferred        Extremities: Normal range of motion.  Edema: None  Mental Status: Normal mood and affect. Normal behavior. Normal judgment and thought content.   Assessment and Plan:  Pregnancy: G1P0 at [redacted]w[redacted]d 1. Encounter for supervision of normal pregnancy, antepartum, unspecified gravidity  Continue to watch BP Feeling good.    Preterm labor symptoms and general obstetric precautions including but not limited to vaginal bleeding, contractions, leaking of fluid and fetal movement were reviewed in detail with the patient. Please refer to After Visit Summary for other counseling recommendations.   No follow-ups on file.  Future Appointments  Date Time Provider Department Center  07/27/2023  1:10 PM  Lesly Dukes, MD [facility]  08/10/2023  1:10 PM Lesly Dukes, MD [facility]  08/25/2023  1:10 PM Myesha Stillion, Harolyn Rutherford, NP [facility]    Venia Carbon, NP

## 2023-07-13 ENCOUNTER — Other Ambulatory Visit: Payer: Self-pay

## 2023-07-13 DIAGNOSIS — F419 Anxiety disorder, unspecified: Secondary | ICD-10-CM

## 2023-07-13 MED ORDER — SERTRALINE HCL 50 MG PO TABS
50.0000 mg | ORAL_TABLET | Freq: Every day | ORAL | 0 refills | Status: DC
Start: 2023-07-13 — End: 2023-10-11

## 2023-07-13 NOTE — Progress Notes (Signed)
Pt needed Rx of Zoloft changed to 90 day supply. Old Rx canceled and 90 day supply sent.

## 2023-07-14 ENCOUNTER — Inpatient Hospital Stay (HOSPITAL_COMMUNITY)
Admission: AD | Admit: 2023-07-14 | Discharge: 2023-07-14 | Disposition: A | Discharge status: Home / Self Care | Payer: 59 | Attending: Obstetrics and Gynecology | Admitting: Obstetrics and Gynecology

## 2023-07-14 ENCOUNTER — Encounter (HOSPITAL_COMMUNITY): Payer: Self-pay | Admitting: Obstetrics and Gynecology

## 2023-07-14 DIAGNOSIS — R519 Headache, unspecified: Secondary | ICD-10-CM | POA: Insufficient documentation

## 2023-07-14 DIAGNOSIS — Z3A30 30 weeks gestation of pregnancy: Secondary | ICD-10-CM | POA: Diagnosis not present

## 2023-07-14 DIAGNOSIS — O26893 Other specified pregnancy related conditions, third trimester: Secondary | ICD-10-CM | POA: Insufficient documentation

## 2023-07-14 LAB — CBC WITH DIFFERENTIAL/PLATELET
Abs Immature Granulocytes: 0.09 10*3/uL — ABNORMAL HIGH (ref 0.00–0.07)
Basophils Absolute: 0 10*3/uL (ref 0.0–0.1)
Basophils Relative: 0 %
Eosinophils Absolute: 0 10*3/uL (ref 0.0–0.5)
Eosinophils Relative: 0 %
HCT: 30.3 % — ABNORMAL LOW (ref 36.0–46.0)
Hemoglobin: 10.3 g/dL — ABNORMAL LOW (ref 12.0–15.0)
Immature Granulocytes: 1 %
Lymphocytes Relative: 13 %
Lymphs Abs: 1.8 10*3/uL (ref 0.7–4.0)
MCH: 30.8 pg (ref 26.0–34.0)
MCHC: 34 g/dL (ref 30.0–36.0)
MCV: 90.7 fL (ref 80.0–100.0)
Monocytes Absolute: 0.7 10*3/uL (ref 0.1–1.0)
Monocytes Relative: 5 %
Neutro Abs: 10.6 10*3/uL — ABNORMAL HIGH (ref 1.7–7.7)
Neutrophils Relative %: 81 %
Platelets: 154 10*3/uL (ref 150–400)
RBC: 3.34 MIL/uL — ABNORMAL LOW (ref 3.87–5.11)
RDW: 13.5 % (ref 11.5–15.5)
WBC: 13.2 10*3/uL — ABNORMAL HIGH (ref 4.0–10.5)
nRBC: 0 % (ref 0.0–0.2)

## 2023-07-14 LAB — COMPREHENSIVE METABOLIC PANEL
ALT: 18 U/L (ref 0–44)
AST: 21 U/L (ref 15–41)
Albumin: 2.6 g/dL — ABNORMAL LOW (ref 3.5–5.0)
Alkaline Phosphatase: 58 U/L (ref 38–126)
Anion gap: 5 (ref 5–15)
BUN: 8 mg/dL (ref 6–20)
CO2: 23 mmol/L (ref 22–32)
Calcium: 9 mg/dL (ref 8.9–10.3)
Chloride: 108 mmol/L (ref 98–111)
Creatinine, Ser: 0.55 mg/dL (ref 0.44–1.00)
GFR, Estimated: >60 mL/min (ref >60)
Glucose, Bld: 91 mg/dL (ref 70–99)
Potassium: 3.8 mmol/L (ref 3.5–5.1)
Sodium: 136 mmol/L (ref 135–145)
Total Bilirubin: 0.5 mg/dL (ref <1.2)
Total Protein: 5.7 g/dL — ABNORMAL LOW (ref 6.5–8.1)

## 2023-07-14 LAB — PROTEIN / CREATININE RATIO, URINE
Creatinine, Urine: 23 mg/dL
Total Protein, Urine: <6 mg/dL

## 2023-07-14 MED ORDER — CYCLOBENZAPRINE HCL 5 MG PO TABS
10.0000 mg | ORAL_TABLET | Freq: Once | ORAL | Status: AC
  Administered 2023-07-14: 10 mg via ORAL
  Filled 2023-07-14: qty 2

## 2023-07-14 MED ORDER — ACETAMINOPHEN-CAFFEINE 500-65 MG PO TABS
2.0000 | ORAL_TABLET | Freq: Once | ORAL | Status: AC
  Administered 2023-07-14: 2 via ORAL
  Filled 2023-07-14: qty 2

## 2023-07-14 MED ORDER — METOCLOPRAMIDE HCL 10 MG PO TABS
10.0000 mg | ORAL_TABLET | Freq: Once | ORAL | Status: AC
  Administered 2023-07-14: 10 mg via ORAL
  Filled 2023-07-14: qty 1

## 2023-07-14 NOTE — MAU Provider Note (Signed)
History     Patient Active Problem List   Diagnosis Date Noted   Bilateral hydronephrosis 04/23/2023   Supervision of normal pregnancy 03/31/2023   Endometriosis 03/31/2023   Anxiety 03/31/2023    Chief Complaint  Patient presents with   Headache   Hypertension   Reports HA for 3-4 days. Has not had a HA since first trimester until now. Has not taken Tylenol for the HA in the past several days, but reports that it did not help in the past. Denies abdominal pain or swelling. Denies vaginal bleeding or discharge. Reports she has been monitored for HTN in office and took a BP at home today and it was 130/90.    OB History     Gravida  1   Para      Term      Preterm      AB      Living         SAB      IAB      Ectopic      Multiple      Live Births              Past Medical History:  Diagnosis Date   Anxiety    Endometriosis    Migraines 03/31/2023    Past Surgical History:  Procedure Laterality Date   LAPAROSCOPY     SEPTOPLASTY      Family History  Problem Relation Age of Onset   Diabetes Neg Hx    Heart disease Neg Hx     Social History   Tobacco Use   Smoking status: Never   Smokeless tobacco: Never  Vaping Use   Vaping status: Never Used  Substance Use Topics   Alcohol use: Never   Drug use: Never    Allergies:  Allergies  Allergen Reactions   Amoxicillin Hives   Penicillins Hives    Medications Prior to Admission  Medication Sig Dispense Refill Last Dose   Prenatal Vit-Fe Fumarate-FA (PRENATAL MULTIVITAMIN) TABS tablet Take 1 tablet by mouth daily at 12 noon.   07/13/2023   sertraline (ZOLOFT) 50 MG tablet Take 1 tablet (50 mg total) by mouth daily. 90 tablet 0 07/13/2023   ondansetron (ZOFRAN) 8 MG tablet Take 1 tablet (8 mg total) by mouth every 8 (eight) hours as needed for nausea or vomiting. (Patient not taking: Reported on 07/10/2023) 20 tablet 0     Review of Systems  Constitutional:  Negative for chills, fever  and malaise/fatigue.  Eyes:  Negative for blurred vision.  Cardiovascular:  Negative for chest pain.  Gastrointestinal:  Positive for nausea. Negative for abdominal pain, constipation, diarrhea and vomiting.  Genitourinary:  Negative for dysuria, frequency, hematuria and urgency.  Neurological:  Positive for headaches.  Psychiatric/Behavioral: Negative.      See HPI Above Physical Exam   Blood pressure 125/68, pulse 96, temperature 98.2 F (36.8 C), resp. rate 16, height 5\' 6"  (1.676 m), weight 67.7 kg, last menstrual period 11/24/2022, SpO2 99%.  No results found for this or any previous visit (from the past 24 hour(s)).  Physical Exam Vitals and nursing note reviewed.  Constitutional:      Appearance: Normal appearance.  HENT:     Head: Normocephalic.  Cardiovascular:     Pulses: Normal pulses.  Pulmonary:     Effort: Pulmonary effort is normal.  Skin:    General: Skin is warm and dry.     Capillary Refill: Capillary refill takes less than  2 seconds.  Neurological:     Mental Status: She is alert and oriented to person, place, and time.  Psychiatric:        Mood and Affect: Mood normal.        Behavior: Behavior normal.        Thought Content: Thought content normal.        Judgment: Judgment normal.    FHR: 135 bpm, Mod Var, occ variables, +Accels UC: UI ED Course  Assessment: - HA 6/10 pain score for several days. Tylenol ordered. - Concern for pre E.  - HA without PreE.   Plan: - Pre E labs. - Medication for HA.  -  Follow Up (Time) - 1650 - HA no better following Tylenol administration. Flexeril and reglan added.  - 1750 - HA not improved after addition of Flexeril and Reglan per patient. Patient sleeping before pain reassessment by RN. Patient desires to be discharged, for rest at home.  - NST reactive.   - Discharge home in stable condition. - Counseled on when to return to MAU, keep all antenatal appointments.    Richardson Landry CNM,  MSN 07/14/2023 3:05 PM

## 2023-07-14 NOTE — MAU Note (Signed)
.  Andrea Dyer is a 22 y.o. at [redacted]w[redacted]d here in MAU reporting: she has had a headache and some nausea for the last few days. Today the headache was worse so she checked her b/p at home 130/90. Denies abd pain or bleeding. Reports positive fetal movement. Has not taken any meds for the headache.  Onset of complaint today Pain score: 6/10 Vitals:   07/14/23 1440  BP: 126/70  Pulse: (!) 101  Resp: 16  Temp: 98.2 F (36.8 C)  SpO2: 97%     FHT:133 Lab orders placed from triage:

## 2023-07-27 ENCOUNTER — Encounter: Payer: 59 | Admitting: Obstetrics & Gynecology

## 2023-07-27 ENCOUNTER — Other Ambulatory Visit: Payer: Self-pay | Admitting: Obstetrics and Gynecology

## 2023-07-27 DIAGNOSIS — F419 Anxiety disorder, unspecified: Secondary | ICD-10-CM

## 2023-07-29 NOTE — Progress Notes (Unsigned)
   PRENATAL VISIT NOTE  Subjective:  Andrea Dyer is a 22 y.o. G1P0 at [redacted]w[redacted]d being seen today for ongoing prenatal care.  She is currently monitored for the following issues for this low-risk pregnancy and has Supervision of normal pregnancy; Endometriosis; and Anxiety on their problem list.  Patient reports {[id number]}.   .  .   . Denies leaking of fluid.   The following portions of the patient's history were reviewed and updated as appropriate: allergies, current medications, past family history, past medical history, past social history, past surgical history and problem list.   Objective:  There were no vitals filed for this visit.  Fetal Status:           General:  Alert, oriented and cooperative. Patient is in no acute distress.  Skin: Skin is warm and dry. No rash noted.   Cardiovascular: Normal heart rate noted  Respiratory: Normal respiratory effort, no problems with respiration noted  Abdomen: Soft, gravid, appropriate for gestational age.        Pelvic: Cervical exam deferred        Extremities: Normal range of motion.     Mental Status: Normal mood and affect. Normal behavior. Normal judgment and thought content.   Assessment and Plan:  Pregnancy: G1P0 at [redacted]w[redacted]d 1. Encounter for supervision of normal pregnancy, antepartum, unspecified gravidity Offered tdap vaccine - pt ***  2. Anxiety Controlled on zoloft  3. Pregnancy with 32 completed weeks gestation   Preterm labor symptoms and general obstetric precautions including but not limited to vaginal bleeding, contractions, leaking of fluid and fetal movement were reviewed in detail with the patient. Please refer to After Visit Summary for other counseling recommendations.   No follow-ups on file.  Future Appointments  Date Time Provider Department Center  07/30/2023  9:50 AM Milas Hock, MD [facility]  08/10/2023  1:10 PM Lesly Dukes, MD [facility]  08/25/2023  1:10 PM Rasch,  Harolyn Rutherford, NP [facility]    Milas Hock, MD

## 2023-07-30 ENCOUNTER — Encounter: Payer: Self-pay | Admitting: Obstetrics and Gynecology

## 2023-07-30 ENCOUNTER — Ambulatory Visit (INDEPENDENT_AMBULATORY_CARE_PROVIDER_SITE_OTHER): Payer: 59 | Admitting: Obstetrics and Gynecology

## 2023-07-30 VITALS — BP 126/79 | HR 101 | Wt 150.0 lb

## 2023-07-30 DIAGNOSIS — Z23 Encounter for immunization: Secondary | ICD-10-CM

## 2023-07-30 DIAGNOSIS — R12 Heartburn: Secondary | ICD-10-CM

## 2023-07-30 DIAGNOSIS — F419 Anxiety disorder, unspecified: Secondary | ICD-10-CM

## 2023-07-30 DIAGNOSIS — Z3490 Encounter for supervision of normal pregnancy, unspecified, unspecified trimester: Secondary | ICD-10-CM

## 2023-07-30 DIAGNOSIS — O99343 Other mental disorders complicating pregnancy, third trimester: Secondary | ICD-10-CM

## 2023-07-30 DIAGNOSIS — Z3A32 32 weeks gestation of pregnancy: Secondary | ICD-10-CM

## 2023-07-30 MED ORDER — OMEPRAZOLE 20 MG PO CPDR
20.0000 mg | DELAYED_RELEASE_CAPSULE | Freq: Every day | ORAL | 3 refills | Status: AC
Start: 2023-07-30 — End: ?

## 2023-08-03 ENCOUNTER — Encounter: Payer: Self-pay | Admitting: *Deleted

## 2023-08-03 DIAGNOSIS — Z3490 Encounter for supervision of normal pregnancy, unspecified, unspecified trimester: Secondary | ICD-10-CM

## 2023-08-10 ENCOUNTER — Other Ambulatory Visit (HOSPITAL_COMMUNITY)
Admission: RE | Admit: 2023-08-10 | Discharge: 2023-08-10 | Disposition: A | Discharge status: Home / Self Care | Payer: 59 | Source: Ambulatory Visit | Attending: Obstetrics & Gynecology | Admitting: Obstetrics & Gynecology

## 2023-08-10 ENCOUNTER — Ambulatory Visit (INDEPENDENT_AMBULATORY_CARE_PROVIDER_SITE_OTHER): Payer: 59 | Admitting: Obstetrics & Gynecology

## 2023-08-10 VITALS — BP 134/84 | HR 101 | Wt 153.0 lb

## 2023-08-10 DIAGNOSIS — Z3490 Encounter for supervision of normal pregnancy, unspecified, unspecified trimester: Secondary | ICD-10-CM

## 2023-08-10 DIAGNOSIS — Z3403 Encounter for supervision of normal first pregnancy, third trimester: Secondary | ICD-10-CM

## 2023-08-10 DIAGNOSIS — N898 Other specified noninflammatory disorders of vagina: Secondary | ICD-10-CM

## 2023-08-10 DIAGNOSIS — Z3A34 34 weeks gestation of pregnancy: Secondary | ICD-10-CM

## 2023-08-10 NOTE — Progress Notes (Signed)
AFI 17.34

## 2023-08-10 NOTE — Progress Notes (Signed)
   PRENATAL VISIT NOTE  Subjective:  Andrea Dyer is a 22 y.o. G1P0 at [redacted]w[redacted]d being seen today for ongoing prenatal care.  She is currently monitored for the following issues for this low-risk pregnancy and has Supervision of normal pregnancy; Endometriosis; and Anxiety on their problem list.  Patient reports  discharge and feels like fundal height has shrunk .  Contractions: Irritability. Vag. Bleeding: None.  Movement: Present. Denies leaking of fluid.   The following portions of the patient's history were reviewed and updated as appropriate: allergies, current medications, past family history, past medical history, past social history, past surgical history and problem list.   Objective:   Vitals:   08/10/23 1310  BP: 134/84  Pulse: (!) 101  Weight: 153 lb (69.4 kg)    Fetal Status: Fetal Heart Rate (bpm): 134   Movement: Present     General:  Alert, oriented and cooperative. Patient is in no acute distress.  Skin: Skin is warm and dry. No rash noted.   Cardiovascular: Normal heart rate noted  Respiratory: Normal respiratory effort, no problems with respiration noted  Abdomen: Soft, gravid, appropriate for gestational age.  Pain/Pressure: Present     Pelvic: Cervical exam performed in the presence of a chaperone      FT to tight 1/ 50/-3/good tone  Extremities: Normal range of motion.  Edema: None  Mental Status: Normal mood and affect. Normal behavior. Normal judgment and thought content.   Assessment and Plan:  Pregnancy: G1P0 at [redacted]w[redacted]d 1. Encounter for supervision of normal pregnancy, antepartum, unspecified gravidity (Primary) Aptima and FFN sent Amniostat and ferning negative Size < Dates--MFM growth [place] AFI today is 17 Reviewed fetal kick counts.  2.  No kidney issues at this time (had hydro)  Preterm labor symptoms and general obstetric precautions including but not limited to vaginal bleeding, contractions, leaking of fluid and fetal movement were reviewed in  detail with the patient. Please refer to After Visit Summary for other counseling recommendations.   No follow-ups on file.  Future Appointments  Date Time Provider Department Center  08/25/2023  1:10 PM Rasch, Harolyn Rutherford, NP [facility]  08/31/2023 10:30 AM Lesly Dukes, MD [facility]  09/07/2023 10:30 AM Lennart Pall, MD [facility]  09/14/2023 10:30 AM Lesly Dukes, MD [facility]  09/21/2023 10:30 AM Penne Lash, Fredrich Romans, MD [facility]    Elsie Lincoln, MD

## 2023-08-11 ENCOUNTER — Encounter: Payer: Self-pay | Admitting: *Deleted

## 2023-08-11 ENCOUNTER — Ambulatory Visit: Payer: 59

## 2023-08-11 ENCOUNTER — Ambulatory Visit: Payer: 59 | Attending: Obstetrics & Gynecology

## 2023-08-11 ENCOUNTER — Other Ambulatory Visit: Payer: Self-pay

## 2023-08-11 VITALS — BP 125/75 | HR 70

## 2023-08-11 DIAGNOSIS — Z3490 Encounter for supervision of normal pregnancy, unspecified, unspecified trimester: Secondary | ICD-10-CM

## 2023-08-11 DIAGNOSIS — Z3A34 34 weeks gestation of pregnancy: Secondary | ICD-10-CM | POA: Diagnosis not present

## 2023-08-11 DIAGNOSIS — Z362 Encounter for other antenatal screening follow-up: Secondary | ICD-10-CM | POA: Insufficient documentation

## 2023-08-11 DIAGNOSIS — O26843 Uterine size-date discrepancy, third trimester: Secondary | ICD-10-CM | POA: Insufficient documentation

## 2023-08-11 LAB — CERVICOVAGINAL ANCILLARY ONLY
Bacterial Vaginitis (gardnerella): NEGATIVE
Candida Glabrata: NEGATIVE
Candida Vaginitis: NEGATIVE
Comment: NEGATIVE
Comment: NEGATIVE
Comment: NEGATIVE
Comment: NEGATIVE
Trichomonas: NEGATIVE

## 2023-08-11 LAB — FETAL FIBRONECTIN: Fetal Fibronectin: NEGATIVE

## 2023-08-25 ENCOUNTER — Other Ambulatory Visit (HOSPITAL_COMMUNITY)
Admission: RE | Admit: 2023-08-25 | Discharge: 2023-08-25 | Disposition: A | Discharge status: Home / Self Care | Payer: 59 | Source: Ambulatory Visit | Attending: Obstetrics and Gynecology | Admitting: Obstetrics and Gynecology

## 2023-08-25 ENCOUNTER — Ambulatory Visit (INDEPENDENT_AMBULATORY_CARE_PROVIDER_SITE_OTHER): Payer: 59 | Admitting: Obstetrics and Gynecology

## 2023-08-25 VITALS — BP 126/82 | HR 101 | Wt 157.0 lb

## 2023-08-25 DIAGNOSIS — Z3490 Encounter for supervision of normal pregnancy, unspecified, unspecified trimester: Secondary | ICD-10-CM | POA: Diagnosis present

## 2023-08-25 DIAGNOSIS — Z3403 Encounter for supervision of normal first pregnancy, third trimester: Secondary | ICD-10-CM | POA: Diagnosis not present

## 2023-08-25 DIAGNOSIS — Z3A36 36 weeks gestation of pregnancy: Secondary | ICD-10-CM

## 2023-08-25 MED ORDER — HYDROCORTISONE ACETATE 25 MG RE SUPP: 25.0000 mg | Freq: Two times a day (BID) | RECTAL | 0 refills | Status: DC

## 2023-08-25 NOTE — Progress Notes (Signed)
   PRENATAL VISIT NOTE  Subjective:  Andrea Dyer is a 22 y.o. G1P0 at [redacted]w[redacted]d being seen today for ongoing prenatal care.  She is currently monitored for the following issues for this low-risk pregnancy and has Supervision of normal pregnancy; Endometriosis; and Anxiety on their problem list.  Patient reports no complaints.  Contractions: Irritability. Vag. Bleeding: None.  Movement: Present. Denies leaking of fluid.   The following portions of the patient's history were reviewed and updated as appropriate: allergies, current medications, past family history, past medical history, past social history, past surgical history and problem list.   Objective:   Vitals:   08/25/23 1315  BP: 126/82  Pulse: (!) 101  Weight: 157 lb (71.2 kg)    Fetal Status: Fetal Heart Rate (bpm): 151 Fundal Height: 37 cm Movement: Present     General:  Alert, oriented and cooperative. Patient is in no acute distress.  Skin: Skin is warm and dry. No rash noted.   Cardiovascular: Normal heart rate noted  Respiratory: Normal respiratory effort, no problems with respiration noted  Abdomen: Soft, gravid, appropriate for gestational age.  Pain/Pressure: Present     Pelvic: Cervical exam performed in the presence of a chaperone Dilation: 1 Effacement (%): Thick Station: -3  Extremities: Normal range of motion.  Edema: None  Mental Status: Normal mood and affect. Normal behavior. Normal judgment and thought content.   Assessment and Plan:  Pregnancy: G1P0 at [redacted]w[redacted]d 1. Encounter for supervision of normal pregnancy, antepartum, unspecified gravidity (Primary)  - Strep Gp B Culture+Rflx - Cervicovaginal ancillary only( [place]) - doing well, good fetal movement - Discussed cervical ripening.   Preterm labor symptoms and general obstetric precautions including but not limited to vaginal bleeding, contractions, leaking of fluid and fetal movement were reviewed in detail with the patient. Please refer to  After Visit Summary for other counseling recommendations.   No follow-ups on file.  Future Appointments  Date Time Provider Department Center  08/31/2023 10:30 AM Cris Burnard DEL, MD [facility]  09/07/2023 10:30 AM Erik Kieth BROCKS, MD [facility]  09/14/2023 10:30 AM Cris Burnard DEL, MD [facility]  09/21/2023 10:30 AM Cris Burnard DEL, MD [facility]    Delon Emms, NP

## 2023-08-25 NOTE — Patient Instructions (Signed)

## 2023-08-26 NOTE — L&D Delivery Note (Cosign Needed Addendum)
OB/GYN Faculty Practice Delivery Note  Andrea Dyer is a 23 y.o. G1P0 s/p SVD at [redacted]w[redacted]d. She was admitted for IOL for gHTN.   ROM: 4h 46m with clear fluid GBS Status: Negative/-- (12/31 1315) Maximum Maternal Temperature: Temp (24hrs), Avg:98 F (36.7 C), Min:97.7 F (36.5 C), Max:98.7 F (37.1 C)   Labor Progress: Initial SVE: closed. AROM, Cytotec, and IP Foley required. She then progressed to complete.   Delivery Date/Time: 09/15/23 @0252  Delivery: Called to room and patient was complete and pushing. Deep variables with pushing. Had quick return to baseline and very quick progress with pushing efforts. Head delivered OA to LOA. No nuchal cord present. Shoulder and body delivered in usual fashion. Infant with spontaneous cry, placed on mother's abdomen, dried and stimulated. Cord clamped x 2 after +1-minute delay, and cut by FOB. Baby brought to warmer for further resuscitation efforts. Baby brought back to mom after laceration repaired, before provider left the room. Cord gases not obtained. Cord blood drawn. Placenta delivered spontaneously with gentle cord traction. Fundus firm with massage and Pitocin. Labia, perineum, and vagina inspected with  small L periurethral and R vaginal/labial which were repaired in the usual fashion with 3.0 Vicryl to achieve hemostasis. Mom and baby to postpartum. Baby Weight: pending  Placenta: 3 vessel, intact. Sent to L&D Complications: None Lacerations: R vaginal/labial, and L periurethral EBL: 122 mL Anesthesia: epidural  Infant: baby girl Rory APGAR (1 MIN): 7  APGAR (5 MINS): 9  APGAR (10 MINS):     Margie Billet, MD [facility] Family Medicine Fellow, [facility] for [facility], [facility] 09/15/2023, 3:24 AM  Attestation of Supervision of Student: I was present, gloved, and participated in the entirety of delivery and third stage. I performed the repair as above.  Joanne Gavel, MD OB  Fellow 09/15/2023 3:30 AM

## 2023-08-27 ENCOUNTER — Inpatient Hospital Stay (HOSPITAL_COMMUNITY)
Admission: AD | Admit: 2023-08-27 | Discharge: 2023-08-27 | Disposition: A | Discharge status: Home / Self Care | Payer: 59 | Attending: Family Medicine | Admitting: Family Medicine

## 2023-08-27 ENCOUNTER — Encounter (HOSPITAL_COMMUNITY): Payer: Self-pay | Admitting: Family Medicine

## 2023-08-27 DIAGNOSIS — Z3493 Encounter for supervision of normal pregnancy, unspecified, third trimester: Secondary | ICD-10-CM

## 2023-08-27 DIAGNOSIS — Z3A36 36 weeks gestation of pregnancy: Secondary | ICD-10-CM | POA: Diagnosis not present

## 2023-08-27 DIAGNOSIS — Z0371 Encounter for suspected problem with amniotic cavity and membrane ruled out: Secondary | ICD-10-CM | POA: Diagnosis not present

## 2023-08-27 DIAGNOSIS — N898 Other specified noninflammatory disorders of vagina: Secondary | ICD-10-CM | POA: Diagnosis present

## 2023-08-27 LAB — WET PREP, GENITAL
Clue Cells Wet Prep HPF POC: NONE SEEN
Sperm: NONE SEEN
Trich, Wet Prep: NONE SEEN
WBC, Wet Prep HPF POC: >=10 — AB (ref <10)
Yeast Wet Prep HPF POC: NONE SEEN

## 2023-08-27 LAB — POCT FERN TEST: POCT Fern Test: NEGATIVE

## 2023-08-27 LAB — CERVICOVAGINAL ANCILLARY ONLY
Chlamydia: NEGATIVE
Comment: NEGATIVE
Comment: NORMAL
Neisseria Gonorrhea: NEGATIVE

## 2023-08-27 NOTE — MAU Provider Note (Signed)
 None      S: Ms. Andrea Dyer is a 23 y.o. G1P0 at [redacted]w[redacted]d  who presents to MAU today complaining of leaking of fluid since 0100, reports clear fluid after urinating. She denies vaginal bleeding. She denies contractions. She reports normal fetal movement.    O: BP 118/77   Pulse 78   Temp 98.5 F (36.9 C) (Oral)   Resp 20   Ht 5' 6 (1.676 m)   Wt 73.1 kg   LMP 11/24/2022   SpO2 98%   BMI 26.02 kg/m  GENERAL: Well-developed, well-nourished female in no acute distress.  HEAD: Normocephalic, atraumatic.  CHEST: Normal effort of breathing, regular heart rate ABDOMEN: Soft, nontender, gravid PELVIC: Normal external female genitalia. Vagina is pink and rugated. Cervix with normal contour, no lesions. Normal discharge.  Negative pooling. Fern Collected. Thick white discharge noted, wet prep collected.   Cervical exam:  Dilation:  (Visually 0.5-1cm)   Fetal Monitoring: FHT: 140/ moderate variability/ 15x15 accels/ absent decels Toco: UI  Results for orders placed or performed during the hospital encounter of 08/27/23 (from the past 24 hours)  Wet prep, genital     Status: Abnormal   Collection Time: 08/27/23  2:43 AM  Result Value Ref Range   Yeast Wet Prep HPF POC NONE SEEN NONE SEEN   Trich, Wet Prep NONE SEEN NONE SEEN   Clue Cells Wet Prep HPF POC NONE SEEN NONE SEEN   WBC, Wet Prep HPF POC >=10 (A) <10   Sperm NONE SEEN    MDM: Fern test Speculum exam NST  A: SIUP at [redacted]w[redacted]d  Membranes intact. Negative pooling, negative fern x2. No further leaking since arrival Cat 1 FHR tracing.   P: Reviewed labor signs and SROM signs with patient and partner. Discharged in stable condition with routine return precautions.  Regino Camie LABOR, CNM 08/27/2023 3:05 AM

## 2023-08-27 NOTE — MAU Note (Signed)
.  Andrea Dyer is a 23 y.o. at [redacted]w[redacted]d here in MAU reporting: possible ROM at 0100 08/27/2023, reports clear gush of fluids and continued leaking, denies wearing a pad at this time. Denies ctxns at this time   Denies VB and reports +FM    Onset of complaint: 0100 Pain score: 0/10 There were no vitals filed for this visit.   QYU:eloozi straight to room  Lab orders placed from triage: MAU labor

## 2023-08-29 LAB — STREP GP B CULTURE+RFLX: Strep Gp B Culture+Rflx: NEGATIVE

## 2023-08-31 ENCOUNTER — Ambulatory Visit (INDEPENDENT_AMBULATORY_CARE_PROVIDER_SITE_OTHER): Payer: 59 | Admitting: Obstetrics & Gynecology

## 2023-08-31 VITALS — BP 128/84 | HR 85 | Wt 159.0 lb

## 2023-08-31 DIAGNOSIS — Z3490 Encounter for supervision of normal pregnancy, unspecified, unspecified trimester: Secondary | ICD-10-CM

## 2023-08-31 NOTE — Progress Notes (Signed)
   PRENATAL VISIT NOTE  Subjective:  Andrea Dyer is a 23 y.o. G1P0 at [redacted]w[redacted]d being seen today for ongoing prenatal care.  She is currently monitored for the following issues for this low-risk pregnancy and has Supervision of normal pregnancy; Endometriosis; and Anxiety on their problem list.  Patient reports no complaints.  Contractions: Not present. Vag. Bleeding: None.  Movement: Present. Denies leaking of fluid.   The following portions of the patient's history were reviewed and updated as appropriate: allergies, current medications, past family history, past medical history, past social history, past surgical history and problem list.   Objective:   Vitals:   08/31/23 1028  BP: 128/84  Pulse: 85  Weight: 159 lb (72.1 kg)    Fetal Status: Fetal Heart Rate (bpm): 144   Movement: Present     General:  Alert, oriented and cooperative. Patient is in no acute distress.  Skin: Skin is warm and dry. No rash noted.   Cardiovascular: Normal heart rate noted  Respiratory: Normal respiratory effort, no problems with respiration noted  Abdomen: Soft, gravid, appropriate for gestational age.  Pain/Pressure: Absent     Pelvic: Cervical exam deferred        Extremities: Normal range of motion.  Edema: None  Mental Status: Normal mood and affect. Normal behavior. Normal judgment and thought content.   Assessment and Plan:  Pregnancy: G1P0 at [redacted]w[redacted]d There are no diagnoses linked to this encounter. Term labor symptoms and general obstetric precautions including but not limited to vaginal bleeding, contractions, leaking of fluid and fetal movement were reviewed in detail with the patient. Please refer to After Visit Summary for other counseling recommendations.   No follow-ups on file.  Future Appointments  Date Time Provider Department Center  09/07/2023 10:30 AM Erik Kieth BROCKS, MD [facility]  09/14/2023 10:30 AM Cris Burnard DEL, MD [facility]  09/21/2023  10:30 AM Cris Burnard DEL, MD [facility]    Burnard Cris, MD

## 2023-08-31 NOTE — Progress Notes (Signed)
   PRENATAL VISIT NOTE  Subjective:  Andrea Dyer is a 23 y.o. G1P0 at [redacted]w[redacted]d being seen today for ongoing prenatal care.  She is currently monitored for the following issues for this low-risk pregnancy and has Supervision of normal pregnancy; Endometriosis; and Anxiety on their problem list.  Patient reports no complaints.  Contractions: Not present. Vag. Bleeding: None.  Movement: Present. Denies leaking of fluid.   The following portions of the patient's history were reviewed and updated as appropriate: allergies, current medications, past family history, past medical history, past social history, past surgical history and problem list.   Objective:   Vitals:   08/31/23 1028  BP: 128/84  Pulse: 85  Weight: 159 lb (72.1 kg)    Fetal Status: Fetal Heart Rate (bpm): 144   Movement: Present     General:  Alert, oriented and cooperative. Patient is in no acute distress.  Skin: Skin is warm and dry. No rash noted.   Cardiovascular: Normal heart rate noted  Respiratory: Normal respiratory effort, no problems with respiration noted  Abdomen: Soft, gravid, appropriate for gestational age.  Pain/Pressure: Absent     Pelvic: Cervical exam deferred        Extremities: Normal range of motion.  Edema: None  Mental Status: Normal mood and affect. Normal behavior. Normal judgment and thought content.   Assessment and Plan:  Pregnancy: G1P0 at [redacted]w[redacted]d There are no diagnoses linked to this encounter. Term labor symptoms and general obstetric precautions including but not limited to vaginal bleeding, contractions, leaking of fluid and fetal movement were reviewed in detail with the patient. Please refer to After Visit Summary for other counseling recommendations.   Return in about 1 week (around 09/07/2023).  Future Appointments  Date Time Provider Department Center  09/07/2023 10:30 AM Erik Kieth BROCKS, MD [facility]  09/14/2023 10:30 AM Cris Burnard DEL, MD [facility]  09/21/2023 10:30 AM Cris Burnard DEL, MD [facility]    Burnard Cris, MD

## 2023-09-06 NOTE — Progress Notes (Signed)
   PRENATAL VISIT NOTE  Subjective:  Andrea Dyer is a 23 y.o. G1P0 at [redacted]w[redacted]d being seen today for ongoing prenatal care.  She is currently monitored for the following issues for this low-risk pregnancy and has Supervision of normal pregnancy; Endometriosis; and Anxiety on their problem list.  Patient reports no complaints.  Contractions: Not present. Vag. Bleeding: None.  Movement: Present. Denies leaking of fluid.   The following portions of the patient's history were reviewed and updated as appropriate: allergies, current medications, past family history, past medical history, past social history, past surgical history and problem list.   Objective:   Vitals:   09/07/23 1033  BP: 124/80  Pulse: 89  Weight: 161 lb (73 kg)    Fetal Status: Fetal Heart Rate (bpm): 134 Fundal Height: 38 cm Movement: Present     General:  Alert, oriented and cooperative. Patient is in no acute distress.  Skin: Skin is warm and dry. No rash noted.   Cardiovascular: Normal heart rate noted  Respiratory: Normal respiratory effort, no problems with respiration noted  Abdomen: Soft, gravid, appropriate for gestational age.  Pain/Pressure: Present      Assessment and Plan:  Pregnancy: G1P0 at [redacted]w[redacted]d 1. Encounter for supervision of normal pregnancy in third trimester, unspecified gravidity (Primary) 3. 38 weeks GBS neg Discussed miles circuit, membrane sweep, si/sx labor and pdIOL.  Plan for membrane sweep next appt pdIOL ordered for 1/31. Discussed role for outpatient FB and patient is considering  2. Anxiety Continue zoloft , no mood concerns today  Please refer to After Visit Summary for other counseling recommendations.   Return in about 1 week (around 09/14/2023) for return OB at 39 weeks.  Future Appointments  Date Time Provider Department Center  09/14/2023 10:30 AM Cris Burnard DEL, MD [facility]  09/21/2023 10:30 AM Cris Burnard DEL, MD [facility]   Kieth JAYSON Carolin, MD

## 2023-09-07 ENCOUNTER — Ambulatory Visit (INDEPENDENT_AMBULATORY_CARE_PROVIDER_SITE_OTHER): Payer: 59 | Admitting: Obstetrics and Gynecology

## 2023-09-07 VITALS — BP 124/80 | HR 89 | Wt 161.0 lb

## 2023-09-07 DIAGNOSIS — Z3493 Encounter for supervision of normal pregnancy, unspecified, third trimester: Secondary | ICD-10-CM

## 2023-09-07 DIAGNOSIS — Z3A38 38 weeks gestation of pregnancy: Secondary | ICD-10-CM

## 2023-09-07 DIAGNOSIS — F419 Anxiety disorder, unspecified: Secondary | ICD-10-CM

## 2023-09-07 DIAGNOSIS — O99343 Other mental disorders complicating pregnancy, third trimester: Secondary | ICD-10-CM

## 2023-09-07 NOTE — Patient Instructions (Signed)
OUTPATIENT FOLEY BULB INDUCTION OF LABOR:  Information Sheet for Mothers and Family               What's a Foley Bulb Induction? A Foley bulb induction is a procedure where your provider inserts a catheter into your cervix. Once inside your womb, your provider inflates the balloon with a saline solution.   This puts pressure on your cervix and encourages dilation. The catheter falls out once your cervix dilates to 3-4 centimeters.     With any procedure, it's important that you know what to expect. The insertion of a Foley catheter can be a bit uncomfortable, and some women experience sharp pelvic pain. The pain may subside once the catheter is in place. You may experience some cramping when the Foley catheter is in place.  This is normal.     GO TO THE MATERNITY ADMISSIONS UNIT FOR THE FOLLOWING: Heavy vaginal bleeding Rupture of membranes (fluid that wets your underwear) Painful uterine contractions every 5 minutes or less Severe abdominal discomfort Decreased movement of the baby   

## 2023-09-14 ENCOUNTER — Ambulatory Visit: Payer: 59 | Admitting: Obstetrics & Gynecology

## 2023-09-14 ENCOUNTER — Inpatient Hospital Stay (HOSPITAL_COMMUNITY): Payer: 59 | Admitting: Anesthesiology

## 2023-09-14 ENCOUNTER — Inpatient Hospital Stay (HOSPITAL_COMMUNITY)
Admission: AD | Admit: 2023-09-14 | Discharge: 2023-09-16 | DRG: 807 | Disposition: A | Discharge status: Home / Self Care | Payer: 59 | Attending: Obstetrics and Gynecology | Admitting: Obstetrics and Gynecology

## 2023-09-14 ENCOUNTER — Encounter (HOSPITAL_COMMUNITY): Payer: Self-pay | Admitting: Obstetrics and Gynecology

## 2023-09-14 ENCOUNTER — Other Ambulatory Visit: Payer: Self-pay

## 2023-09-14 VITALS — BP 141/86 | HR 90 | Wt 163.0 lb

## 2023-09-14 DIAGNOSIS — Z3493 Encounter for supervision of normal pregnancy, unspecified, third trimester: Secondary | ICD-10-CM

## 2023-09-14 DIAGNOSIS — Z3A39 39 weeks gestation of pregnancy: Secondary | ICD-10-CM

## 2023-09-14 DIAGNOSIS — Z23 Encounter for immunization: Secondary | ICD-10-CM | POA: Diagnosis not present

## 2023-09-14 DIAGNOSIS — Z3490 Encounter for supervision of normal pregnancy, unspecified, unspecified trimester: Secondary | ICD-10-CM

## 2023-09-14 DIAGNOSIS — K219 Gastro-esophageal reflux disease without esophagitis: Secondary | ICD-10-CM | POA: Diagnosis present

## 2023-09-14 DIAGNOSIS — O133 Gestational [pregnancy-induced] hypertension without significant proteinuria, third trimester: Secondary | ICD-10-CM

## 2023-09-14 DIAGNOSIS — Z880 Allergy status to penicillin: Secondary | ICD-10-CM | POA: Diagnosis not present

## 2023-09-14 DIAGNOSIS — F419 Anxiety disorder, unspecified: Secondary | ICD-10-CM | POA: Diagnosis present

## 2023-09-14 DIAGNOSIS — O9902 Anemia complicating childbirth: Secondary | ICD-10-CM | POA: Diagnosis present

## 2023-09-14 DIAGNOSIS — O9962 Diseases of the digestive system complicating childbirth: Secondary | ICD-10-CM | POA: Diagnosis present

## 2023-09-14 DIAGNOSIS — Z79899 Other long term (current) drug therapy: Secondary | ICD-10-CM

## 2023-09-14 DIAGNOSIS — O134 Gestational [pregnancy-induced] hypertension without significant proteinuria, complicating childbirth: Principal | ICD-10-CM | POA: Diagnosis present

## 2023-09-14 DIAGNOSIS — O139 Gestational [pregnancy-induced] hypertension without significant proteinuria, unspecified trimester: Secondary | ICD-10-CM | POA: Diagnosis present

## 2023-09-14 DIAGNOSIS — O99344 Other mental disorders complicating childbirth: Secondary | ICD-10-CM | POA: Diagnosis present

## 2023-09-14 DIAGNOSIS — N809 Endometriosis, unspecified: Secondary | ICD-10-CM | POA: Diagnosis present

## 2023-09-14 LAB — COMPREHENSIVE METABOLIC PANEL
ALT: 12 U/L (ref 0–44)
AST: 19 U/L (ref 15–41)
Albumin: 2.8 g/dL — ABNORMAL LOW (ref 3.5–5.0)
Alkaline Phosphatase: 103 U/L (ref 38–126)
Anion gap: 8 (ref 5–15)
BUN: 8 mg/dL (ref 6–20)
CO2: 21 mmol/L — ABNORMAL LOW (ref 22–32)
Calcium: 8.7 mg/dL — ABNORMAL LOW (ref 8.9–10.3)
Chloride: 107 mmol/L (ref 98–111)
Creatinine, Ser: 0.67 mg/dL (ref 0.44–1.00)
GFR, Estimated: >60 mL/min (ref >60)
Glucose, Bld: 82 mg/dL (ref 70–99)
Potassium: 4.3 mmol/L (ref 3.5–5.1)
Sodium: 136 mmol/L (ref 135–145)
Total Bilirubin: 0.4 mg/dL (ref 0.0–1.2)
Total Protein: 5.9 g/dL — ABNORMAL LOW (ref 6.5–8.1)

## 2023-09-14 LAB — CBC
HCT: 33.3 % — ABNORMAL LOW (ref 36.0–46.0)
Hemoglobin: 11.6 g/dL — ABNORMAL LOW (ref 12.0–15.0)
MCH: 31.8 pg (ref 26.0–34.0)
MCHC: 34.8 g/dL (ref 30.0–36.0)
MCV: 91.2 fL (ref 80.0–100.0)
Platelets: 154 10*3/uL (ref 150–400)
RBC: 3.65 MIL/uL — ABNORMAL LOW (ref 3.87–5.11)
RDW: 12.7 % (ref 11.5–15.5)
WBC: 12.2 10*3/uL — ABNORMAL HIGH (ref 4.0–10.5)
nRBC: 0 % (ref 0.0–0.2)

## 2023-09-14 LAB — TYPE AND SCREEN
ABO/RH(D): A POS
Antibody Screen: NEGATIVE

## 2023-09-14 LAB — PROTEIN / CREATININE RATIO, URINE
Creatinine, Urine: 71 mg/dL
Protein Creatinine Ratio: 0.1 mg/mg{creat} (ref 0.00–0.15)
Total Protein, Urine: 7 mg/dL

## 2023-09-14 LAB — CBC WITH DIFFERENTIAL/PLATELET
Abs Immature Granulocytes: 0.05 10*3/uL (ref 0.00–0.07)
Basophils Absolute: 0 10*3/uL (ref 0.0–0.1)
Basophils Relative: 0 %
Eosinophils Absolute: 0 10*3/uL (ref 0.0–0.5)
Eosinophils Relative: 0 %
HCT: 32.9 % — ABNORMAL LOW (ref 36.0–46.0)
Hemoglobin: 11.5 g/dL — ABNORMAL LOW (ref 12.0–15.0)
Immature Granulocytes: 1 %
Lymphocytes Relative: 15 %
Lymphs Abs: 1.6 10*3/uL (ref 0.7–4.0)
MCH: 32.4 pg (ref 26.0–34.0)
MCHC: 35 g/dL (ref 30.0–36.0)
MCV: 92.7 fL (ref 80.0–100.0)
Monocytes Absolute: 0.8 10*3/uL (ref 0.1–1.0)
Monocytes Relative: 7 %
Neutro Abs: 8.6 10*3/uL — ABNORMAL HIGH (ref 1.7–7.7)
Neutrophils Relative %: 77 %
Platelets: 161 10*3/uL (ref 150–400)
RBC: 3.55 MIL/uL — ABNORMAL LOW (ref 3.87–5.11)
RDW: 12.8 % (ref 11.5–15.5)
WBC: 11.1 10*3/uL — ABNORMAL HIGH (ref 4.0–10.5)
nRBC: 0 % (ref 0.0–0.2)

## 2023-09-14 MED ORDER — ONDANSETRON HCL 4 MG/2ML IJ SOLN
4.0000 mg | Freq: Four times a day (QID) | INTRAMUSCULAR | Status: DC | PRN
  Administered 2023-09-15: 4 mg via INTRAVENOUS
  Filled 2023-09-14: qty 2

## 2023-09-14 MED ORDER — LACTATED RINGERS IV SOLN
500.0000 mL | Freq: Once | INTRAVENOUS | Status: AC
  Administered 2023-09-14: 500 mL via INTRAVENOUS

## 2023-09-14 MED ORDER — LIDOCAINE HCL (PF) 1 % IJ SOLN
INTRAMUSCULAR | Status: DC | PRN
  Administered 2023-09-14 (×2): 5 mL via EPIDURAL

## 2023-09-14 MED ORDER — LIDOCAINE HCL (PF) 1 % IJ SOLN: 30.0000 mL | INTRAMUSCULAR | Status: DC | PRN

## 2023-09-14 MED ORDER — SODIUM CHLORIDE 0.9% FLUSH: 3.0000 mL | INTRAVENOUS | Status: DC | PRN

## 2023-09-14 MED ORDER — OXYTOCIN BOLUS FROM INFUSION
333.0000 mL | Freq: Once | INTRAVENOUS | Status: AC
  Administered 2023-09-15: 333 mL via INTRAVENOUS

## 2023-09-14 MED ORDER — MISOPROSTOL 50MCG HALF TABLET
50.0000 ug | ORAL_TABLET | Freq: Once | ORAL | Status: AC
  Administered 2023-09-14: 50 ug via ORAL
  Filled 2023-09-14: qty 1

## 2023-09-14 MED ORDER — LACTATED RINGERS IV SOLN: 500.0000 mL | INTRAVENOUS | Status: DC | PRN

## 2023-09-14 MED ORDER — SOD CITRATE-CITRIC ACID 500-334 MG/5ML PO SOLN: 30.0000 mL | ORAL | Status: DC | PRN

## 2023-09-14 MED ORDER — PHENYLEPHRINE 80 MCG/ML (10ML) SYRINGE FOR IV PUSH (FOR BLOOD PRESSURE SUPPORT)
80.0000 ug | PREFILLED_SYRINGE | INTRAVENOUS | Status: DC | PRN
  Filled 2023-09-14: qty 10

## 2023-09-14 MED ORDER — FENTANYL-BUPIVACAINE-NACL 0.5-0.125-0.9 MG/250ML-% EP SOLN
12.0000 mL/h | EPIDURAL | Status: DC | PRN
  Administered 2023-09-14: 12 mL/h via EPIDURAL
  Filled 2023-09-14: qty 250

## 2023-09-14 MED ORDER — SODIUM CHLORIDE 0.9% FLUSH: 3.0000 mL | Freq: Two times a day (BID) | INTRAVENOUS | Status: DC

## 2023-09-14 MED ORDER — TERBUTALINE SULFATE 1 MG/ML IJ SOLN: 0.2500 mg | Freq: Once | INTRAMUSCULAR | Status: DC | PRN

## 2023-09-14 MED ORDER — DIPHENHYDRAMINE HCL 50 MG/ML IJ SOLN: 12.5000 mg | INTRAMUSCULAR | Status: DC | PRN

## 2023-09-14 MED ORDER — SODIUM CHLORIDE 0.9 % IV SOLN: 250.0000 mL | INTRAVENOUS | Status: DC | PRN

## 2023-09-14 MED ORDER — LACTATED RINGERS IV SOLN: INTRAVENOUS | Status: AC

## 2023-09-14 MED ORDER — OXYTOCIN-SODIUM CHLORIDE 30-0.9 UT/500ML-% IV SOLN
2.5000 [IU]/h | INTRAVENOUS | Status: DC
  Administered 2023-09-15: 2.5 [IU]/h via INTRAVENOUS
  Filled 2023-09-14: qty 500

## 2023-09-14 MED ORDER — ACETAMINOPHEN 325 MG PO TABS: 650.0000 mg | ORAL_TABLET | ORAL | Status: DC | PRN

## 2023-09-14 MED ORDER — FENTANYL CITRATE (PF) 100 MCG/2ML IJ SOLN: 50.0000 ug | INTRAMUSCULAR | Status: DC | PRN

## 2023-09-14 MED ORDER — MISOPROSTOL 25 MCG QUARTER TABLET
25.0000 ug | ORAL_TABLET | Freq: Once | ORAL | Status: AC
  Administered 2023-09-14: 25 ug via VAGINAL
  Filled 2023-09-14: qty 1

## 2023-09-14 MED ORDER — EPHEDRINE 5 MG/ML INJ: 10.0000 mg | INTRAVENOUS | Status: DC | PRN

## 2023-09-14 MED ORDER — PHENYLEPHRINE 80 MCG/ML (10ML) SYRINGE FOR IV PUSH (FOR BLOOD PRESSURE SUPPORT): 80.0000 ug | PREFILLED_SYRINGE | INTRAVENOUS | Status: DC | PRN

## 2023-09-14 NOTE — Anesthesia Procedure Notes (Signed)
Epidural Patient location during procedure: OB Start time: 09/14/2023 10:50 PM End time: 09/14/2023 10:59 PM  Staffing Anesthesiologist: Mal Amabile, MD Performed: anesthesiologist   Preanesthetic Checklist Completed: patient identified, IV checked, site marked, risks and benefits discussed, surgical consent, monitors and equipment checked, pre-op evaluation and timeout performed  Epidural Patient position: sitting Prep: DuraPrep and site prepped and draped Patient monitoring: continuous pulse ox and blood pressure Approach: midline Location: L3-L4 Injection technique: LOR air  Needle:  Needle type: Tuohy  Needle gauge: 17 G Needle length: 9 cm and 9 Needle insertion depth: 4 cm Catheter type: closed end flexible Catheter size: 19 Gauge Catheter at skin depth: 9 cm Test dose: negative and Other  Assessment Events: blood not aspirated, no cerebrospinal fluid, injection not painful, no injection resistance, no paresthesia and negative IV test  Additional Notes Patient identified. Risks and benefits discussed including failed block, incomplete  Pain control, post dural puncture headache, nerve damage, paralysis, blood pressure Changes, nausea, vomiting, reactions to medications-both toxic and allergic and post Partum back pain. All questions were answered. Patient expressed understanding and wished to proceed. Sterile technique was used throughout procedure. Epidural site was Dressed with sterile barrier dressing. No paresthesias, signs of intravascular injection Or signs of intrathecal spread were encountered.  Patient was more comfortable after the epidural was dosed. Please see RN's note for documentation of vital signs and FHR which are stable. Reason for block:procedure for pain

## 2023-09-14 NOTE — Progress Notes (Signed)
Pt has head headaches for the past week- no visual changes    PRENATAL VISIT NOTE  Subjective:  Andrea Dyer is a 23 y.o. G1P0 at [redacted]w[redacted]d being seen today for ongoing prenatal care.  She is currently monitored for the following issues for this low-risk pregnancy and has Supervision of normal pregnancy; Endometriosis; and Anxiety on their problem list.  Patient reports headache.  Contractions: Irritability. Vag. Bleeding: None.  Movement: Present. Denies leaking of fluid.   The following portions of the patient's history were reviewed and updated as appropriate: allergies, current medications, past family history, past medical history, past social history, past surgical history and problem list.   Objective:   Vitals:   09/14/23 1026  BP: (!) 153/88  Pulse: 93  Weight: 163 lb (73.9 kg)    Fetal Status: Fetal Heart Rate (bpm): 132   Movement: Present     General:  Alert, oriented and cooperative. Patient is in no acute distress.  Skin: Skin is warm and dry. No rash noted.   Cardiovascular: Normal heart rate noted  Respiratory: Normal respiratory effort, no problems with respiration noted  Abdomen: Soft, gravid, appropriate for gestational age.  Pain/Pressure: Present     Pelvic: Cervical exam performed in the presence of a chaperone        Extremities: Normal range of motion.  Edema: None  Mental Status: Normal mood and affect. Normal behavior. Normal judgment and thought content.   Assessment and Plan:  Pregnancy: G1P0 at [redacted]w[redacted]d with elevated BP and headache   Will send to MAU for BP monitoring , labs and probably induction since >39 weeks.   Report called to MAU (APP not available to speak--spoke with RN). VTX confirmed with cervical exam.  There are no diagnoses linked to this encounter. Term labor symptoms and general obstetric precautions including but not limited to vaginal bleeding, contractions, leaking of fluid and fetal movement were reviewed in detail with the  patient. Please refer to After Visit Summary for other counseling recommendations.   No follow-ups on file.  Future Appointments  Date Time Provider Department Center  09/21/2023 10:30 AM Lesly Dukes, MD [facility]  09/25/2023  7:00 AM [facility] SCHED ROOM [facility] None    Elsie Lincoln, MD

## 2023-09-14 NOTE — Progress Notes (Signed)
Patient ID: Andrea Dyer, female   DOB: [birth year], 23 y.o.   MRN: [id number] Labor Progress Note [name] is a 23 y.o. G1P0 at [redacted]w[redacted]d presented for IOL for gHTN S: Feeling well, laying in bed. Would like epidural after AROM.   O:  BP 128/82 (BP Location: Left Arm)   Pulse 89   Temp 98.7 F (37.1 C) (Oral)   Resp 16   Ht 5\' 6"  (1.676 m)   Wt 74.1 kg   LMP 11/24/2022   SpO2 100%   BMI 26.37 kg/m  EFM: 140bpm/+accels/no decels  CVE: Dilation: 3 Effacement (%): 50 Station: -2 Presentation: Vertex Exam by:: Acquanetta Belling, MD   [name]: 23 y.o. G1P0 [redacted]w[redacted]d for IOL for gHTN.  #Labor: Progressing well. AROM'd. S/p dual Cytotec and FB.  #Pain: epidural #FWB: Cat 1 #GBS negative  Margie Billet, MD 10:28 PM

## 2023-09-14 NOTE — H&P (Signed)
OBSTETRIC ADMISSION HISTORY AND PHYSICAL  Andrea Dyer is a 23 y.o. female G1P0 with IUP at [redacted]w[redacted]d by ultrasound presenting for IOL for GHTN. She reports +FMs, No LOF, no VB, no blurry vision, headaches or peripheral edema, and RUQ pain.  She plans on breast feeding. She request Lng-IUD at 6 week followup for birth control. She received her prenatal care at  [facility].    Dating: By ultrasound --->  Estimated Date of Delivery: 09/18/23  Sono:    @[redacted]w[redacted]d , CWD, normal anatomy, cephalic presentation, anterior placenta lie, 2402g, 38% EFW   Prenatal History/Complications:   NURSING  PROVIDER  Office Location [place] Dating by U/S at 18 wks  [facility] Model Traditional Anatomy U/S Normal, EDC changed   Initiated care at  18wks                Language  English              LAB RESULTS   Support Person FOB [facility] [employer] NIPS: Declined  AFP: Declined   RSV12 07/31/23 NT/IT (FT only)     Carrier Screen Horizon: Declined  Rhogam  A/Positive/-- (08/06 1514) A1C/GTT Early:  Third trimester: 77, 119, 114, Normal   Flu Vaccine Declined    TDaP Vaccine  07/30/23 Blood Type A/Positive/-- (08/06 1514)  Covid Vaccine  Antibody Negative (08/06 1514)    Rubella 1.98 (08/06 1514)  Feeding Plan Breast  RPR Non Reactive (08/06 1514)  Contraception Lng-IUD, 6 wk visit HBsAg Negative (08/06 1514)  Circumcision NA HIV Non Reactive (08/06 1514)  Pediatrician  [facility] HCVAb Non Reactive (08/06 1514)  Prenatal Classes       Pap No results found for: "DIAGPAP"  BTLConsent  GC/CT Initial:   36wks:  neg  VBAC  Consent NA GBS Negative 08/25/23       DME Rx [ ]  BP cuff [ ]  Weight Scale Waterbirth  [ ]  Class [ ]  Consent [ ]  CNM visit  PHQ9 & GAD7 [ x ] new OB [ x ] 28 weeks  [  ] 36 weeks Induction  [ ]  Orders Entered [ ] Foley Y/N     Past Medical History: Past Medical History:  Diagnosis Date   Anxiety    Endometriosis    Migraines 03/31/2023    Past Surgical History: Past  Surgical History:  Procedure Laterality Date   LAPAROSCOPY     SEPTOPLASTY      Obstetrical History: OB History     Gravida  1   Para      Term      Preterm      AB      Living         SAB      IAB      Ectopic      Multiple      Live Births              Social History Social History   Socioeconomic History   Marital status: Married    Spouse name: Not on file   Number of children: Not on file   Years of education: Not on file   Highest education level: Not on file  Occupational History   Not on file  Tobacco Use   Smoking status: Never   Smokeless tobacco: Never  Vaping Use   Vaping status: Never Used  Substance and Sexual Activity   Alcohol use: Never   Drug use: Never   Sexual activity: Yes  Birth control/protection: None  Other Topics Concern   Not on file  Social History Narrative   Not on file   Social Drivers of Health   Financial Resource Strain: Not on file  Food Insecurity: Not on file  Transportation Needs: Not on file  Physical Activity: Not on file  Stress: Not on file  Social Connections: Unknown (09/26/2022)   Received from [facility]   Social Network    Social Network: Not on file    Family History: Family History  Problem Relation Age of Onset   Healthy Mother    Diabetes Neg Hx    Heart disease Neg Hx     Allergies: Allergies  Allergen Reactions   Amoxicillin Hives   Penicillins Hives    Medications Prior to Admission  Medication Sig Dispense Refill Last Dose/Taking   Prenatal Vit-Fe Fumarate-FA (PRENATAL MULTIVITAMIN) TABS tablet Take 1 tablet by mouth daily at 12 noon.   Past Week   sertraline (ZOLOFT) 50 MG tablet Take 1 tablet (50 mg total) by mouth daily. 90 tablet 0 09/13/2023   hydrocortisone (ANUSOL-HC) 25 MG suppository Place 1 suppository (25 mg total) rectally 2 (two) times daily. 12 suppository 0 09/11/2023   omeprazole (PRILOSEC) 20 MG capsule Take 1 capsule (20 mg total) by mouth daily.  30 capsule 3 09/12/2023     Review of Systems   All systems reviewed and negative except as stated in HPI  Blood pressure 130/82, pulse 84, temperature 98 F (36.7 C), temperature source Oral, resp. rate 18, height 5\' 6"  (1.676 m), weight 74.1 kg, last menstrual period 11/24/2022, SpO2 99%. General appearance: alert, cooperative, and appears stated age Lungs: clear to auscultation bilaterally Heart: regular rate and rhythm Abdomen: soft, non-tender; bowel sounds normal Extremities: Homans sign is negative, no sign of DVT DTR's normal Presentation: cephalic Fetal monitoringBaseline: 125 bpm, Variability: Good {> 6 bpm), Accelerations: Reactive, and Decelerations: Absent Uterine activityFrequency: Every 2-5 minutes     Prenatal labs: ABO, Rh: A/Positive/-- (08/06 1514) Antibody: Negative (08/06 1514) Rubella: 1.98 (08/06 1514) RPR: Non Reactive (11/01 0918)  HBsAg: Negative (08/06 1514)  HIV: Non Reactive (11/01 0918)  GBS: Negative/-- (12/31 1315)    Lab Results  Component Value Date   GBS Negative 08/25/2023   GTT FBS 77, 1 hour 119, 2 hour 114 Genetic screening  declined Anatomy US normal  Immunization History  Administered Date(s) Administered   MMR 10/02/2021   RSV,unspecified 07/31/2023   Tdap 07/30/2023    Prenatal Transfer Tool  Maternal Diabetes: No Genetic Screening: declined Maternal Ultrasounds/Referrals: Normal Fetal Ultrasounds or other Referrals:  None Maternal Substance Abuse:  No Significant Maternal Medications:  Meds include: Zoloft Significant Maternal Lab Results: Group B Strep negative Number of Prenatal Visits:greater than 3 verified prenatal visits Maternal Vaccinations:TDap 07/30/23, declined flu vaccine Other Comments:  None   Results for orders placed or performed during the hospital encounter of 09/14/23 (from the past 24 hours)  Protein / creatinine ratio, urine   Collection Time: 09/14/23  1:00 PM  Result Value Ref Range    Creatinine, Urine 71 mg/dL   Total Protein, Urine 7 mg/dL   Protein Creatinine Ratio 0.10 0.00 - 0.15 mg/mg[Cre]  CBC with Differential/Platelet   Collection Time: 09/14/23  1:40 PM  Result Value Ref Range   WBC 11.1 (H) 4.0 - 10.5 K/uL   RBC 3.55 (L) 3.87 - 5.11 MIL/uL   Hemoglobin 11.5 (L) 12.0 - 15.0 g/dL   HCT 54.0 (L) 98.1 - 19.1 %  MCV 92.7 80.0 - 100.0 fL   MCH 32.4 26.0 - 34.0 pg   MCHC 35.0 30.0 - 36.0 g/dL   RDW 82.9 56.2 - 13.0 %   Platelets 161 150 - 400 K/uL   nRBC 0.0 0.0 - 0.2 %   Neutrophils Relative % 77 %   Neutro Abs 8.6 (H) 1.7 - 7.7 K/uL   Lymphocytes Relative 15 %   Lymphs Abs 1.6 0.7 - 4.0 K/uL   Monocytes Relative 7 %   Monocytes Absolute 0.8 0.1 - 1.0 K/uL   Eosinophils Relative 0 %   Eosinophils Absolute 0.0 0.0 - 0.5 K/uL   Basophils Relative 0 %   Basophils Absolute 0.0 0.0 - 0.1 K/uL   Immature Granulocytes 1 %   Abs Immature Granulocytes 0.05 0.00 - 0.07 K/uL  Comprehensive metabolic panel   Collection Time: 09/14/23  1:40 PM  Result Value Ref Range   Sodium 136 135 - 145 mmol/L   Potassium 4.3 3.5 - 5.1 mmol/L   Chloride 107 98 - 111 mmol/L   CO2 21 (L) 22 - 32 mmol/L   Glucose, Bld 82 70 - 99 mg/dL   BUN 8 6 - 20 mg/dL   Creatinine, Ser 8.65 0.44 - 1.00 mg/dL   Calcium 8.7 (L) 8.9 - 10.3 mg/dL   Total Protein 5.9 (L) 6.5 - 8.1 g/dL   Albumin 2.8 (L) 3.5 - 5.0 g/dL   AST 19 15 - 41 U/L   ALT 12 0 - 44 U/L   Alkaline Phosphatase 103 38 - 126 U/L   Total Bilirubin 0.4 0.0 - 1.2 mg/dL   GFR, Estimated >78 >46 mL/min   Anion gap 8 5 - 15    Patient Active Problem List   Diagnosis Date Noted   Encounter for induction of labor 09/14/2023   Supervision of normal pregnancy 03/31/2023   Endometriosis 03/31/2023   Anxiety 03/31/2023    Assessment/Plan:  Andrea Dyer is a 23 y.o. G1P0 at [redacted]w[redacted]d here for IOL for GHTN. Discussed induction process.  #Labor: FB and cytotec #Pain: epidural #FWB: Category I #GBS status:   negative #Feeding: Breastmilk  #Reproductive Life planning: IUD Mirena #Circ:  not applicable  Iantha Fallen, Student-MidWife  09/14/2023, 3:44 PM

## 2023-09-14 NOTE — Progress Notes (Signed)
Andrea Dyer is a 23 y.o. G1P0 at [redacted]w[redacted]d by ultrasound admitted for induction of labor due to [facility].  Subjective: Patient feeling intermittent cramping. States she excited to begin her induction. Family at bedside.  Objective: BP 131/82   Pulse 81   Temp 98 F (36.7 C) (Oral)   Resp 18   Ht 5\' 6"  (1.676 m)   Wt 74.1 kg   LMP 11/24/2022   SpO2 99%   BMI 26.37 kg/m    FHT:  FHR: 130 bpm, variability: moderate,  accelerations:  Present,  decelerations:  Absent UC:   irregular, every 2-10 minutes SVE:    1.5/50/-2  Labs: Lab Results  Component Value Date   WBC 11.1 (H) 09/14/2023   HGB 11.5 (L) 09/14/2023   HCT 32.9 (L) 09/14/2023   MCV 92.7 09/14/2023   PLT 161 09/14/2023    Assessment / Plan: Induction of labor due to gestational hypertension, induction process explained and patient amenable to foley balloon and dual cytotec. Foley balloon placed and dual cytotec started with 50 mcg PO and 25 mcg vaginal.  Labor:  Normal induction process, foley balloon and dual cytotec placed  at 1810 Preeclampsia:   BP's stable, had a couple mild range BP's that did not require tx. Denies ha, visual changes, RUQ pain, chest pain, or SOB. Fetal Wellbeing:  Category I Pain Control:  Epidural PRN I/D:   GBS negative Anticipated MOD:  NSVD  Iantha Fallen, Student-MidWife 09/14/2023, 6:30 PM

## 2023-09-14 NOTE — MAU Provider Note (Signed)
Chief Complaint:  BP Evaluation and Headache   HPI   None     Andrea Dyer is a 23 y.o. G1P0 at [redacted]w[redacted]d who presents to maternity admissions after being sent from office following a BP reading of 153/88. She reports a mild HA that is not new, but declines medication. She denies epigastric pain or edema.   Pregnancy Course: Anxiety  Past Medical History:  Diagnosis Date   Anxiety    Endometriosis    Migraines 03/31/2023   OB History  Gravida Para Term Preterm AB Living  1       SAB IAB Ectopic Multiple Live Births          # Outcome Date GA Lbr Len/2nd Weight Sex Type Anes PTL Lv  1 Current            Past Surgical History:  Procedure Laterality Date   LAPAROSCOPY     SEPTOPLASTY     Family History  Problem Relation Age of Onset   Healthy Mother    Diabetes Neg Hx    Heart disease Neg Hx    Social History   Tobacco Use   Smoking status: Never   Smokeless tobacco: Never  Vaping Use   Vaping status: Never Used  Substance Use Topics   Alcohol use: Never   Drug use: Never   Allergies  Allergen Reactions   Amoxicillin Hives   Penicillins Hives   Medications Prior to Admission  Medication Sig Dispense Refill Last Dose/Taking   Prenatal Vit-Fe Fumarate-FA (PRENATAL MULTIVITAMIN) TABS tablet Take 1 tablet by mouth daily at 12 noon.   Past Week   sertraline (ZOLOFT) 50 MG tablet Take 1 tablet (50 mg total) by mouth daily. 90 tablet 0 09/13/2023   hydrocortisone (ANUSOL-HC) 25 MG suppository Place 1 suppository (25 mg total) rectally 2 (two) times daily. 12 suppository 0 09/11/2023   omeprazole (PRILOSEC) 20 MG capsule Take 1 capsule (20 mg total) by mouth daily. 30 capsule 3 09/12/2023    I have reviewed patient's Past Medical Hx, Surgical Hx, Family Hx, Social Hx, medications and allergies.   ROS  Pertinent items noted in HPI and remainder of comprehensive ROS otherwise negative.   PHYSICAL EXAM  Patient Vitals for the past 24 hrs:  BP Temp Temp src Pulse  Resp SpO2 Height Weight  09/14/23 1535 -- -- -- -- -- 99 % -- --  09/14/23 1531 130/82 -- -- 84 -- -- -- --  09/14/23 1530 -- -- -- -- -- 99 % -- --  09/14/23 1525 -- -- -- -- -- 97 % -- --  09/14/23 1520 -- -- -- -- -- 99 % -- --  09/14/23 1515 128/86 -- -- 84 -- 100 % -- --  09/14/23 1510 -- -- -- -- -- 99 % -- --  09/14/23 1505 -- -- -- -- -- 99 % -- --  09/14/23 1500 129/82 -- -- 84 -- 100 % -- --  09/14/23 1455 -- -- -- -- -- 99 % -- --  09/14/23 1450 -- -- -- -- -- 99 % -- --  09/14/23 1445 137/75 -- -- 85 -- 99 % -- --  09/14/23 1440 -- -- -- -- -- 99 % -- --  09/14/23 1435 -- -- -- -- -- 99 % -- --  09/14/23 1431 136/81 -- -- 86 -- -- -- --  09/14/23 1430 -- -- -- -- -- 100 % -- --  09/14/23 1425 -- -- -- -- -- 99 % -- --  09/14/23 1420 -- -- -- -- -- 99 % -- --  09/14/23 1415 (!) 136/94 -- -- 100 -- 99 % -- --  09/14/23 1410 -- -- -- -- -- 99 % -- --  09/14/23 1405 -- -- -- -- -- 99 % -- --  09/14/23 1400 122/83 -- -- 93 -- 99 % -- --  09/14/23 1355 -- -- -- -- -- 99 % -- --  09/14/23 1353 127/80 -- -- 81 -- -- -- --  09/14/23 1350 -- -- -- -- -- 98 % -- --  09/14/23 1345 -- -- -- -- -- 99 % -- --  09/14/23 1340 -- -- -- -- -- 99 % -- --  09/14/23 1335 -- -- -- -- -- 98 % -- --  09/14/23 1330 123/78 -- -- 86 -- 99 % -- --  09/14/23 1325 -- -- -- -- -- 99 % -- --  09/14/23 1320 -- -- -- -- -- 99 % -- --  09/14/23 1315 130/78 -- -- 89 -- 98 % -- --  09/14/23 1310 -- -- -- -- -- 98 % -- --  09/14/23 1305 -- -- -- -- -- 100 % -- --  09/14/23 1303 129/83 -- -- 81 -- -- -- --  09/14/23 1246 133/85 98 F (36.7 C) Oral 90 18 99 % -- --  09/14/23 1242 -- -- -- -- -- -- 5\' 6"  (1.676 m) 74.1 kg    Physical Exam Vitals reviewed.  Constitutional:      General: She is not in acute distress.    Appearance: She is well-developed. She is not ill-appearing, toxic-appearing or diaphoretic.  Cardiovascular:     Rate and Rhythm: Normal rate.  Pulmonary:     Effort: Pulmonary  effort is normal.  Musculoskeletal:        General: Normal range of motion.  Skin:    General: Skin is warm and dry.  Neurological:     Mental Status: She is alert.  Psychiatric:        Mood and Affect: Mood normal.        Speech: Speech normal.        Behavior: Behavior normal.      Fetal Tracing: Baseline: 135 Variability: moderate Accelerations: 15x15 Decelerations: absent Toco: 3-5    Labs: Results for orders placed or performed during the hospital encounter of 09/14/23 (from the past 24 hours)  Protein / creatinine ratio, urine     Status: None   Collection Time: 09/14/23  1:00 PM  Result Value Ref Range   Creatinine, Urine 71 mg/dL   Total Protein, Urine 7 mg/dL   Protein Creatinine Ratio 0.10 0.00 - 0.15 mg/mg[Cre]  CBC with Differential/Platelet     Status: Abnormal   Collection Time: 09/14/23  1:40 PM  Result Value Ref Range   WBC 11.1 (H) 4.0 - 10.5 K/uL   RBC 3.55 (L) 3.87 - 5.11 MIL/uL   Hemoglobin 11.5 (L) 12.0 - 15.0 g/dL   HCT 50.9 (L) 32.6 - 71.2 %   MCV 92.7 80.0 - 100.0 fL   MCH 32.4 26.0 - 34.0 pg   MCHC 35.0 30.0 - 36.0 g/dL   RDW 45.8 09.9 - 83.3 %   Platelets 161 150 - 400 K/uL   nRBC 0.0 0.0 - 0.2 %   Neutrophils Relative % 77 %   Neutro Abs 8.6 (H) 1.7 - 7.7 K/uL   Lymphocytes Relative 15 %   Lymphs Abs 1.6 0.7 - 4.0 K/uL   Monocytes  Relative 7 %   Monocytes Absolute 0.8 0.1 - 1.0 K/uL   Eosinophils Relative 0 %   Eosinophils Absolute 0.0 0.0 - 0.5 K/uL   Basophils Relative 0 %   Basophils Absolute 0.0 0.0 - 0.1 K/uL   Immature Granulocytes 1 %   Abs Immature Granulocytes 0.05 0.00 - 0.07 K/uL  Comprehensive metabolic panel     Status: Abnormal   Collection Time: 09/14/23  1:40 PM  Result Value Ref Range   Sodium 136 135 - 145 mmol/L   Potassium 4.3 3.5 - 5.1 mmol/L   Chloride 107 98 - 111 mmol/L   CO2 21 (L) 22 - 32 mmol/L   Glucose, Bld 82 70 - 99 mg/dL   BUN 8 6 - 20 mg/dL   Creatinine, Ser 0.86 0.44 - 1.00 mg/dL   Calcium 8.7  (L) 8.9 - 10.3 mg/dL   Total Protein 5.9 (L) 6.5 - 8.1 g/dL   Albumin 2.8 (L) 3.5 - 5.0 g/dL   AST 19 15 - 41 U/L   ALT 12 0 - 44 U/L   Alkaline Phosphatase 103 38 - 126 U/L   Total Bilirubin 0.4 0.0 - 1.2 mg/dL   GFR, Estimated >57 >84 mL/min   Anion gap 8 5 - 15    Imaging:  No results found.  MDM & MAU COURSE  MDM:  MAU Course: Orders Placed This Encounter  Procedures   CBC with Differential/Platelet   Comprehensive metabolic panel   Protein / creatinine ratio, urine   RPR   Diet laboring Room service appropriate? Yes   SCDs   Vitals signs per unit policy   Notify physician (specify)   Fetal monitoring per unit policy   Activity as tolerated   Cervical Exam   Measure blood pressure post delivery every 15 min x 1 hour then every 30 min x 1 hour   Fundal check post delivery every 15 min x 1 hour then every 30 min x 1 hour   Apply Labor & Delivery Care Plan   Patient may have epidural placement upon request   If Rapid HIV test positive or known HIV positive: initiate AZT orders   May in and out cath x 2 for inability to void   Insert urethral catheter X 1 PRN If Coude Catheter is chosen, qualified resources by campus can be found in the clinical skills nursing procedure for Coude Catheter 1. If straight catheterized > 2 times or patient unable to void post epidural plac...   Refer to Sidebar Report Urinary (Foley) Catheter Indications   Refer to Sidebar Report Post Indwelling Urinary Catheter Removal and Intervention Guidelines   Discontinue foley prior to vaginal delivery   Initiate Oral Care Protocol   Initiate Carrier Fluid Protocol   SCDs   Perform a cervical exam prior to initiating Cytotec or Pitocin   Discontinue Pitocin if tachysystole with non-reassuring FHR is present   Notify physician (specify) Tachysystole is defined as more than 5 contractions in a 10-minute time period averaged over a 30-minute window   Initiate intrauterine resuscitation if tachysystole  with non-reassuring FHR is present   Notify physician (specify) Tachysystole is defined as more than 5 contractions in a 10-minute time period averaged over a 30-minute window   May administer Terbutaline 0.25 mg SQ x 1 dose if tachysystole with non-reassuring FHR is present   Labor Induction   Full code   Type and screen   Insert and maintain IV Line   Admit to Inpatient (  patient's expected length of stay will be greater than 2 midnights or inpatient only procedure)   Meds ordered this encounter  Medications   oxytocin (PITOCIN) IV BOLUS FROM BAG   oxytocin (PITOCIN) IV infusion 30 units in NS 500 mL - Premix   lactated ringers infusion 500-1,000 mL   acetaminophen (TYLENOL) tablet 650 mg   ondansetron (ZOFRAN) injection 4 mg   sodium citrate-citric acid (ORACIT) solution 30 mL   lidocaine (PF) (XYLOCAINE) 1 % injection 30 mL   sodium chloride flush (NS) 0.9 % injection 3 mL   sodium chloride flush (NS) 0.9 % injection 3 mL   0.9 %  sodium chloride infusion   fentaNYL (SUBLIMAZE) injection 50-100 mcg   terbutaline (BRETHINE) injection 0.25 mg   AND Linked Order Group    misoprostol (CYTOTEC) tablet 50 mcg    misoprostol (CYTOTEC) tablet 25 mcg    ASSESSMENT   1. Encounter for supervision of normal pregnancy in third trimester, unspecified gravidity    Patient with one elevated ambulatory blood pressure and one mild range here. Suspect GHTN at term.  PLAN  Admit to L&D for IOL at 39.3 Care turned over to L&D team.    Lamont Snowball, MSN, CNM 09/14/2023 3:46 PM  Certified Nurse Midwife, [facility]

## 2023-09-14 NOTE — Anesthesia Preprocedure Evaluation (Signed)
Anesthesia Evaluation  Patient identified by MRN, date of birth, ID band Patient awake    Reviewed: Allergy & Precautions, Patient's Chart, lab work & pertinent test results  Airway Mallampati: II       Dental no notable dental hx.    Pulmonary neg pulmonary ROS   Pulmonary exam normal        Cardiovascular negative cardio ROS Normal cardiovascular exam Rhythm:Regular Rate:Normal     Neuro/Psych  Headaches  Anxiety        GI/Hepatic Neg liver ROS,GERD  Medicated,,  Endo/Other  negative endocrine ROS    Renal/GU   negative genitourinary   Musculoskeletal negative musculoskeletal ROS (+)    Abdominal Normal abdominal exam  (+)   Peds  Hematology  (+) Blood dyscrasia, anemia   Anesthesia Other Findings   Reproductive/Obstetrics (+) Pregnancy                              Anesthesia Physical Anesthesia Plan  ASA: 2  Anesthesia Plan: Epidural   Post-op Pain Management: Minimal or no pain anticipated   Induction: Intravenous  PONV Risk Score and Plan:   Airway Management Planned: Natural Airway  Additional Equipment: None and Fetal Monitoring  Intra-op Plan:   Post-operative Plan:   Informed Consent: I have reviewed the patients History and Physical, chart, labs and discussed the procedure including the risks, benefits and alternatives for the proposed anesthesia with the patient or authorized representative who has indicated his/her understanding and acceptance.       Plan Discussed with: Anesthesiologist  Anesthesia Plan Comments:          Anesthesia Quick Evaluation

## 2023-09-14 NOTE — MAU Note (Signed)
Andrea Dyer is a 23 y.o. at [redacted]w[redacted]d here in MAU reporting: she was sent from [facility] office for BP evaluation and faint HA that began last night.  Denies visual disturbances and epigastric pain.  Denies VB or LOF.  Reports +FM.  LMP: NA Onset of complaint: today Pain score: 2 Vitals:   09/14/23 1246  BP: 133/85  Pulse: 90  Resp: 18  Temp: 98 F (36.7 C)  SpO2: 99%     FHT: 145 bpm  Lab orders placed from triage: None

## 2023-09-15 ENCOUNTER — Encounter (HOSPITAL_COMMUNITY): Payer: Self-pay | Admitting: Obstetrics and Gynecology

## 2023-09-15 DIAGNOSIS — Z3A39 39 weeks gestation of pregnancy: Secondary | ICD-10-CM

## 2023-09-15 DIAGNOSIS — O134 Gestational [pregnancy-induced] hypertension without significant proteinuria, complicating childbirth: Secondary | ICD-10-CM

## 2023-09-15 DIAGNOSIS — O139 Gestational [pregnancy-induced] hypertension without significant proteinuria, unspecified trimester: Secondary | ICD-10-CM | POA: Diagnosis present

## 2023-09-15 DIAGNOSIS — O99344 Other mental disorders complicating childbirth: Secondary | ICD-10-CM

## 2023-09-15 HISTORY — DX: Gestational (pregnancy-induced) hypertension without significant proteinuria, unspecified trimester: O13.9

## 2023-09-15 LAB — CBC
HCT: 34 % — ABNORMAL LOW (ref 36.0–46.0)
Hemoglobin: 11.8 g/dL — ABNORMAL LOW (ref 12.0–15.0)
MCH: 31.9 pg (ref 26.0–34.0)
MCHC: 34.7 g/dL (ref 30.0–36.0)
MCV: 91.9 fL (ref 80.0–100.0)
Platelets: 167 10*3/uL (ref 150–400)
RBC: 3.7 MIL/uL — ABNORMAL LOW (ref 3.87–5.11)
RDW: 12.6 % (ref 11.5–15.5)
WBC: 20.3 10*3/uL — ABNORMAL HIGH (ref 4.0–10.5)
nRBC: 0 % (ref 0.0–0.2)

## 2023-09-15 LAB — RPR: RPR Ser Ql: NONREACTIVE

## 2023-09-15 MED ORDER — BENZOCAINE-MENTHOL 20-0.5 % EX AERO
1.0000 | INHALATION_SPRAY | CUTANEOUS | Status: DC | PRN
  Administered 2023-09-15: 1 via TOPICAL
  Filled 2023-09-15: qty 56

## 2023-09-15 MED ORDER — PRENATAL MULTIVITAMIN CH
1.0000 | ORAL_TABLET | Freq: Every day | ORAL | Status: DC
  Administered 2023-09-15 – 2023-09-16 (×2): 1 via ORAL
  Filled 2023-09-15 (×2): qty 1

## 2023-09-15 MED ORDER — TETANUS-DIPHTH-ACELL PERTUSSIS 5-2.5-18.5 LF-MCG/0.5 IM SUSY: 0.5000 mL | PREFILLED_SYRINGE | Freq: Once | INTRAMUSCULAR | Status: DC

## 2023-09-15 MED ORDER — IBUPROFEN 600 MG PO TABS
600.0000 mg | ORAL_TABLET | Freq: Four times a day (QID) | ORAL | Status: DC
  Administered 2023-09-15 – 2023-09-16 (×6): 600 mg via ORAL
  Filled 2023-09-15 (×6): qty 1

## 2023-09-15 MED ORDER — ZOLPIDEM TARTRATE 5 MG PO TABS: 5.0000 mg | ORAL_TABLET | Freq: Every evening | ORAL | Status: DC | PRN

## 2023-09-15 MED ORDER — ONDANSETRON HCL 4 MG PO TABS
4.0000 mg | ORAL_TABLET | ORAL | Status: DC | PRN
  Administered 2023-09-15: 4 mg via ORAL
  Filled 2023-09-15: qty 1

## 2023-09-15 MED ORDER — WITCH HAZEL-GLYCERIN EX PADS: 1.0000 | MEDICATED_PAD | CUTANEOUS | Status: DC | PRN

## 2023-09-15 MED ORDER — ERYTHROMYCIN 5 MG/GM OP OINT
TOPICAL_OINTMENT | OPHTHALMIC | Status: AC
  Filled 2023-09-15: qty 1

## 2023-09-15 MED ORDER — SIMETHICONE 80 MG PO CHEW: 80.0000 mg | CHEWABLE_TABLET | ORAL | Status: DC | PRN

## 2023-09-15 MED ORDER — ONDANSETRON HCL 4 MG/2ML IJ SOLN: 4.0000 mg | INTRAMUSCULAR | Status: DC | PRN

## 2023-09-15 MED ORDER — ACETAMINOPHEN 325 MG PO TABS: 650.0000 mg | ORAL_TABLET | ORAL | Status: DC | PRN

## 2023-09-15 MED ORDER — FUROSEMIDE 20 MG PO TABS
40.0000 mg | ORAL_TABLET | Freq: Every day | ORAL | Status: DC
  Administered 2023-09-15 – 2023-09-16 (×2): 40 mg via ORAL
  Filled 2023-09-15 (×2): qty 2

## 2023-09-15 MED ORDER — INFLUENZA VIRUS VACC SPLIT PF (FLUZONE) 0.5 ML IM SUSY
0.5000 mL | PREFILLED_SYRINGE | INTRAMUSCULAR | Status: AC
  Administered 2023-09-16: 0.5 mL via INTRAMUSCULAR
  Filled 2023-09-15: qty 0.5

## 2023-09-15 MED ORDER — SENNOSIDES-DOCUSATE SODIUM 8.6-50 MG PO TABS
2.0000 | ORAL_TABLET | Freq: Every day | ORAL | Status: DC
  Administered 2023-09-16: 2 via ORAL
  Filled 2023-09-15 (×2): qty 2

## 2023-09-15 MED ORDER — DIPHENHYDRAMINE HCL 25 MG PO CAPS: 25.0000 mg | ORAL_CAPSULE | Freq: Four times a day (QID) | ORAL | Status: DC | PRN

## 2023-09-15 MED ORDER — COCONUT OIL OIL
1.0000 | TOPICAL_OIL | Status: DC | PRN
  Administered 2023-09-16: 1 via TOPICAL

## 2023-09-15 MED ORDER — DIBUCAINE (PERIANAL) 1 % EX OINT: 1.0000 | TOPICAL_OINTMENT | CUTANEOUS | Status: DC | PRN

## 2023-09-15 NOTE — Progress Notes (Signed)
Patient ID: Andrea Dyer, female   DOB: 2000-09-21, 23 y.o.   MRN: [id number] Labor Progress Note Andrea Dyer is a 23 y.o. G1P0 at [redacted]w[redacted]d presented for IOL for gHTN S: S/p epidural. Laying comfortably on her Left side.   O:  BP 127/69 Comment: Simultaneous filing. User may not have seen previous data.  Pulse 72 Comment: Simultaneous filing. User may not have seen previous data.  Temp 97.7 F (36.5 C) (Oral)   Resp 16   Ht 5\' 6"  (1.676 m)   Wt 74.1 kg   LMP 11/24/2022   SpO2 100% Comment: Simultaneous filing. User may not have seen previous data.  BMI 26.37 kg/m  EFM: 110bpm/+accels/+early decels  CVE: Dilation: Lip/rim Effacement (%): 90 Station: 0 Presentation: Vertex Exam by:: Dr. Acquanetta Belling   [name]: 23 y.o. G1P0 [redacted]w[redacted]d for IOL #Labor: Progressing well. S/p Cytotec, FB, and AROM.  #Pain: epidural #FWB: Cat II #GBS negative #gHTN: No severe range pressures. Continue to monitor.   Margie Billet, MD 1:42 AM

## 2023-09-15 NOTE — Lactation Note (Signed)
This note was copied from a baby's chart. Lactation Consultation Note  Patient Name: Andrea Dyer [id number] Date: 09/15/2023 Age:22 hours Reason for consult: RN request;Primapara;Term RN asked LC to see mom that baby wasn't BF much. LC went to see mom and mom stated the baby had just finished feeding and she BF really good. FOB was changing diaper. Congratulated mom. Mom has no questions at this time.  Maternal Data    Feeding    LATCH Score                    Lactation Tools Discussed/Used    Interventions    Discharge    Consult Status Consult Status: Follow-up Date: 09/16/23 Follow-up type: In-patient    Charyl Dancer 09/15/2023, 8:23 PM

## 2023-09-15 NOTE — Discharge Summary (Signed)
Postpartum Discharge Summary  Date of Service updated***     Patient Name: Andrea Dyer DOB: 04/20/2001 MRN: [id number]  Date of admission: 09/14/2023 Delivery date:09/15/2023 Delivering provider: Joanne Gavel Date of discharge: 09/15/2023  Admitting diagnosis: Encounter for induction of labor [Z34.90] Intrauterine pregnancy: [redacted]w[redacted]d     Secondary diagnosis:  Principal Problem:   SVD (spontaneous vaginal delivery) Active Problems:   Supervision of normal pregnancy   Endometriosis   Anxiety   Gestational hypertension  Additional problems: ***    Discharge diagnosis: Term Pregnancy Delivered and Gestational Hypertension                                              Post partum procedures:*** Augmentation: AROM, Cytotec, and IP Foley Complications: None  Hospital course: Induction of Labor With Vaginal Delivery   23 y.o. yo G1P0 at [redacted]w[redacted]d was admitted to the hospital 09/14/2023 for induction of labor.  Indication for induction: Gestational hypertension.  Patient had an labor course complicated by none. Membrane Rupture Time/Date: 10:24 PM,09/14/2023  Delivery Method:Vaginal, Spontaneous Operative Delivery:N/A Episiotomy: None Lacerations:  Periurethral;Labial Details of delivery can be found in separate delivery note.  Patient had a postpartum course complicated by***. Patient is discharged home 09/15/23.  Newborn Data: Birth date:09/15/2023 Birth time:2:52 AM Gender:Female Living status:Living Apgars: ,  Weight:   Magnesium Sulfate received: {Mag received:30440022} BMZ received: No Rhophylac:No MMR:No T-DaP:Given prenatally Flu: No RSV Vaccine received: Yes Transfusion:{Transfusion received:30440034}  Immunizations received: Immunization History  Administered Date(s) Administered   MMR 10/02/2021   RSV,unspecified 07/31/2023   Tdap 07/30/2023    Physical exam  Vitals:   09/15/23 0100 09/15/23 0130 09/15/23 0149 09/15/23 0230  BP: 127/69 133/88   133/86  Pulse: 72 97  95  Resp:      Temp:   97.7 F (36.5 C)   TempSrc:   Oral   SpO2: 100% 100%    Weight:      Height:       General: {Exam; general:21111117} Lochia: {Desc; appropriate/inappropriate:30686::"appropriate"} Uterine Fundus: {Desc; firm/soft:30687} Incision: {Exam; incision:21111123} DVT Evaluation: {Exam; dvt:2111122} Labs: Lab Results  Component Value Date   WBC 12.2 (H) 09/14/2023   HGB 11.6 (L) 09/14/2023   HCT 33.3 (L) 09/14/2023   MCV 91.2 09/14/2023   PLT 154 09/14/2023      Latest Ref Rng & Units 09/14/2023    1:40 PM  CMP  Glucose 70 - 99 mg/dL 82   BUN 6 - 20 mg/dL 8   Creatinine 7.82 - 9.56 mg/dL 2.13   Sodium 086 - 578 mmol/L 136   Potassium 3.5 - 5.1 mmol/L 4.3   Chloride 98 - 111 mmol/L 107   CO2 22 - 32 mmol/L 21   Calcium 8.9 - 10.3 mg/dL 8.7   Total Protein 6.5 - 8.1 g/dL 5.9   Total Bilirubin 0.0 - 1.2 mg/dL 0.4   Alkaline Phos 38 - 126 U/L 103   AST 15 - 41 U/L 19   ALT 0 - 44 U/L 12    Edinburgh Score:     No data to display         No data recorded  After visit meds:  Allergies as of 09/15/2023       Reactions   Amoxicillin Hives   Penicillins Hives     Med Rec must be completed prior  to using this [facility]***        Discharge home in stable condition Infant Feeding: {Baby feeding:23562} Infant Disposition:{CHL IP OB HOME WITH ONGEXB:28413} Discharge instruction: per After Visit Summary and Postpartum booklet. Activity: Advance as tolerated. Pelvic rest for 6 weeks.  Diet: {OB KGMW:10272536} Future Appointments: Future Appointments  Date Time Provider Department Center  09/21/2023 10:30 AM Lesly Dukes, MD [facility]  10/14/2023  1:10 PM Lennart Pall, MD [facility]   Follow up Visit:  Message sent to [facility] 1/21  Please schedule this patient for a In person postpartum visit in 6 weeks with the following provider: Any provider. Additional Postpartum F/U:BP check 1 week   Low risk pregnancy complicated by: HTN Delivery mode:  Vaginal, Spontaneous Anticipated Birth Control:  IUD   09/15/2023 Joanne Gavel, MD

## 2023-09-15 NOTE — Lactation Note (Signed)
This note was copied from a baby's chart. Lactation Consultation Note  Patient Name: Andrea Dyer [id number] Date: 09/15/2023 Age:22 hours Reason for consult: Initial assessment;Primapara;Term When LC went into rm RN holding baby stated baby is gagging trying to spit up.  Newborn feeding habits, behavior, STS, I&O reviewed. LC used suction bulb to clear some bubbles from baby's mouth. Encouraged to feed on demand and try at least every 3 hrs. Write down attempts when tries to BF. Mom encouraged to feed baby 8-12 times/24 hours and with feeding cues.  Encouraged to call for assistance as needed.  Maternal Data Has patient been taught Hand Expression?: No Does the patient have breastfeeding experience prior to this delivery?: No  Feeding    LATCH Score Latch: Too sleepy or reluctant, no latch achieved, no sucking elicited.                  Lactation Tools Discussed/Used    Interventions Interventions: Arts administrator  Discharge    Consult Status Consult Status: Follow-up Date: 09/15/23 (in pm) Follow-up type: In-patient    Charyl Dancer 09/15/2023, 6:41 AM

## 2023-09-15 NOTE — Lactation Note (Addendum)
This note was copied from a baby's chart. Lactation Consultation Note  Patient Name: Andrea Dyer [id number] Date: 09/15/2023 Age:22 hours Reason for consult: Follow-up assessment;Term;Primapara;1st time breastfeeding  P1, 39 wks, @ 10 hrs of life. Infant StS with mom @ LC arrival- per mom- just attempted. Mom receptive to assist. Demonstrated waking baby. Demonstrated positioning for cross-cradle. Discussed expectations of baby readiness @ breast- Day 1 sleepy, Day 2 more awake/ showing feeding cues, and cluster feeding overnight to bring in milk. Encouraged to start with hand expression- great colostrum visualized. Demonstrated steps of latching, teaching baby to big mouth latch. Baby responds well, bursts of energy- couple stops to wake baby up and re-latch. Overall 10 minutes of good work @ breast. Baby pops off- asleep- content. Mom does have pump @ home. Coconut oil provided, discussed EBM or coconut oil after each feed for nipple health. Encouraged mom to call for assist as desired. Shared [facility] services and milk storage handouts with mom.  Maternal Data Has patient been taught Hand Expression?: Yes Does the patient have breastfeeding experience prior to this delivery?: No  Feeding Mother's Current Feeding Choice: Breast Milk  LATCH Score Latch: Grasps breast easily, tongue down, lips flanged, rhythmical sucking.  Audible Swallowing: Spontaneous and intermittent  Type of Nipple: Everted at rest and after stimulation  Comfort (Breast/Nipple): Soft / non-tender  Hold (Positioning): Assistance needed to correctly position infant at breast and maintain latch.  LATCH Score: 9   Lactation Tools Discussed/Used    Interventions Interventions: Breast feeding basics reviewed;Assisted with latch;Hand express;Breast compression;Support pillows;Coconut oil;Expressed milk;Education  Discharge Pump: Personal (Per mom has a pump @ home)  Consult Status Consult Status:  Follow-up Date: 09/16/23 Follow-up type: In-patient    [facility] 09/15/2023, 1:18 PM

## 2023-09-15 NOTE — Anesthesia Postprocedure Evaluation (Signed)
Anesthesia Post Note  Patient: Talicia G Kliethermes  Procedure(s) Performed: AN AD HOC LABOR EPIDURAL     Patient location during evaluation: Mother Baby Anesthesia Type: Epidural Level of consciousness: awake and alert and oriented Pain management: satisfactory to patient Vital Signs Assessment: post-procedure vital signs reviewed and stable Respiratory status: respiratory function stable Cardiovascular status: stable Postop Assessment: no headache, no backache, epidural receding, patient able to bend at knees, no signs of nausea or vomiting, adequate PO intake and able to ambulate Anesthetic complications: no   No notable events documented.  Last Vitals:  Vitals:   09/15/23 0520 09/15/23 0620  BP: 136/87 126/78  Pulse: 80 71  Resp: 18 18  Temp: 36.9 C   SpO2: 99% 98%    Last Pain:  Vitals:   09/15/23 0620  TempSrc:   PainSc: 3    Pain Goal:                   Karleen Dolphin

## 2023-09-16 ENCOUNTER — Telehealth: Payer: Self-pay | Admitting: *Deleted

## 2023-09-16 ENCOUNTER — Other Ambulatory Visit (HOSPITAL_COMMUNITY): Payer: Self-pay

## 2023-09-16 MED ORDER — FUROSEMIDE 40 MG PO TABS
40.0000 mg | ORAL_TABLET | Freq: Every day | ORAL | 0 refills | Status: DC
  Filled 2023-09-16: qty 6, 6d supply, fill #0

## 2023-09-16 MED ORDER — IBUPROFEN 600 MG PO TABS
600.0000 mg | ORAL_TABLET | Freq: Four times a day (QID) | ORAL | 0 refills | Status: AC
  Filled 2023-09-16: qty 30, 8d supply, fill #0

## 2023-09-16 MED ORDER — SENNOSIDES-DOCUSATE SODIUM 8.6-50 MG PO TABS
2.0000 | ORAL_TABLET | Freq: Every day | ORAL | 1 refills | Status: AC
  Filled 2023-09-16: qty 60, 30d supply, fill #0

## 2023-09-16 NOTE — Telephone Encounter (Signed)
Left patient a message to keep appointment on 09/21/2023. Office changed appointment from OB to Postpartum per hospital provider staff message.

## 2023-09-16 NOTE — Lactation Note (Addendum)
This note was copied from a baby's chart. Lactation Consultation Note  Patient Name: Andrea Dyer [id number] Date: 09/16/2023 Age:22 hours Reason for consult: Follow-up assessment;Primapara;1st time breastfeeding;Term  P1, 39 wks,@ 31 hrs. Baby asleep StS with mom- per mom just finished feeding. Highlighted @8 % weight loss, 7-10% "NORMAL" on DC day. Discussed progression of baby readiness at breast, more awake/ feeding cues, cluster feeding overnight to bring milk in, and longer feeds. Encouraged mom to start with hand expression or hand pump, use breast compression through feeding (more milk/fat to baby), and offer both breasts to help combat irritability/ initial weight loss at breast. Emphasized EBM or coconut oil post feeds for nipple care and really working on big mouth latch today. Highlighted risk of engorgement as milk comes in. Discussed hand expression/ hand pump to soften breast, motrin as anti-inflammatory, and ice packs for 10-20 minutes post feed/pumping are the best treatments for inflamed/engorged breast. Addressed questions on vitamin D use.   Maternal Data Has patient been taught Hand Expression?: Yes Does the patient have breastfeeding experience prior to this delivery?: No  Feeding Mother's Current Feeding Choice: Breast Milk   Interventions Interventions: Breast feeding basics reviewed;Hand express;Breast compression;Expressed milk;Coconut oil;Education  Discharge Pump: Personal (Per mom has a pump @ home)  Consult Status Consult Status: Complete Date: 09/16/23    Idamae Lusher 09/16/2023, 12:14 PM

## 2023-09-16 NOTE — Progress Notes (Signed)
 MOB was referred for history of anxiety.  * Referral screened out by Clinical Social Worker because none of the following criteria appear to apply:  ~ History of anxiety during this pregnancy, or of post-partum depression following prior delivery.  ~ Diagnosis of anxiety within last 3 years  OR  * MOB's symptoms currently being treated with medication and/or therapy.  Per OB notes, MOB is prescribed Zoloft 50mg  for support.  Please contact the Clinical Social Worker if needs arise, by [facility] request, or if MOB scores greater than 9/yes to question 10 on Edinburgh Postpartum Depression Screen.  Enos Fling, Theresia Majors Clinical Social Worker [phone]

## 2023-09-17 ENCOUNTER — Encounter: Payer: Self-pay | Admitting: Obstetrics & Gynecology

## 2023-09-21 ENCOUNTER — Ambulatory Visit (INDEPENDENT_AMBULATORY_CARE_PROVIDER_SITE_OTHER): Payer: 59 | Admitting: Obstetrics & Gynecology

## 2023-09-21 ENCOUNTER — Encounter: Payer: Self-pay | Admitting: Obstetrics & Gynecology

## 2023-09-21 VITALS — BP 129/88 | HR 86 | Ht 66.0 in | Wt 145.0 lb

## 2023-09-21 DIAGNOSIS — O133 Gestational [pregnancy-induced] hypertension without significant proteinuria, third trimester: Secondary | ICD-10-CM

## 2023-09-21 NOTE — Progress Notes (Signed)
   Subjective:    Patient ID: Andrea Dyer, female    DOB: 2000/12/11, 23 y.o.   MRN: [id number]  HPI  23 yo G1P1 female s/p NSVD induced for gestational HTN  Review of Systems  Constitutional: Negative.   Respiratory: Negative.    Cardiovascular: Negative.   Gastrointestinal: Negative.   Genitourinary: Negative.   Neurological:  Positive for headaches.       Pt has hx of migraines.  Contolled with exedrin migraine meds.  No scotomata       Objective:   Physical Exam Vitals reviewed.  Constitutional:      General: She is not in acute distress.    Appearance: She is well-developed.  HENT:     Head: Normocephalic and atraumatic.  Eyes:     Conjunctiva/sclera: Conjunctivae normal.  Cardiovascular:     Rate and Rhythm: Normal rate.  Pulmonary:     Effort: Pulmonary effort is normal.  Skin:    General: Skin is warm and dry.  Neurological:     Mental Status: She is alert and oriented to person, place, and time.  Psychiatric:        Mood and Affect: Mood normal.    Vitals:   09/21/23 1033  BP: 129/88  Pulse: 86  Weight: 145 lb (65.8 kg)  Height: 5\' 6"  (1.676 m)      Assessment & Plan:  23 yo female 1 week post delivery   BP nml today; continue to take BP daily for pp baby Rx S/p lasix and not on other BP meds RTC 5 weeks or sooner with issues or concerns

## 2023-09-25 ENCOUNTER — Inpatient Hospital Stay (HOSPITAL_COMMUNITY): Payer: 59

## 2023-09-25 ENCOUNTER — Inpatient Hospital Stay (HOSPITAL_COMMUNITY): Admission: RE | Admit: 2023-09-25 | Payer: 59 | Source: Home / Self Care | Admitting: Obstetrics & Gynecology

## 2023-10-11 ENCOUNTER — Other Ambulatory Visit: Payer: Self-pay | Admitting: Obstetrics and Gynecology

## 2023-10-11 DIAGNOSIS — F419 Anxiety disorder, unspecified: Secondary | ICD-10-CM

## 2023-10-13 ENCOUNTER — Telehealth: Payer: Self-pay | Admitting: *Deleted

## 2023-10-13 NOTE — Telephone Encounter (Signed)
 Left patient a message that the office has to cancel her appointment on 10/14/2023 due to the office closing at 12:00 PM due to the weather.

## 2023-10-14 ENCOUNTER — Ambulatory Visit: Payer: 59 | Admitting: Obstetrics and Gynecology

## 2023-10-19 MED ORDER — SERTRALINE HCL 50 MG PO TABS: 50.0000 mg | ORAL_TABLET | Freq: Every day | ORAL | 0 refills | Status: AC

## 2023-10-29 ENCOUNTER — Encounter: Payer: Self-pay | Admitting: Obstetrics and Gynecology

## 2023-10-29 ENCOUNTER — Ambulatory Visit: Payer: 59 | Admitting: Obstetrics and Gynecology

## 2023-10-29 ENCOUNTER — Other Ambulatory Visit (HOSPITAL_COMMUNITY)
Admission: RE | Admit: 2023-10-29 | Discharge: 2023-10-29 | Disposition: A | Discharge status: Home / Self Care | Source: Ambulatory Visit | Attending: Obstetrics and Gynecology | Admitting: Obstetrics and Gynecology

## 2023-10-29 VITALS — BP 124/83 | HR 74 | Ht 66.0 in | Wt 139.0 lb

## 2023-10-29 DIAGNOSIS — Z3202 Encounter for pregnancy test, result negative: Secondary | ICD-10-CM

## 2023-10-29 DIAGNOSIS — Z124 Encounter for screening for malignant neoplasm of cervix: Secondary | ICD-10-CM | POA: Diagnosis present

## 2023-10-29 DIAGNOSIS — Z30430 Encounter for insertion of intrauterine contraceptive device: Secondary | ICD-10-CM

## 2023-10-29 LAB — POCT URINE PREGNANCY: Preg Test, Ur: NEGATIVE

## 2023-10-29 MED ORDER — LEVONORGESTREL 20 MCG/DAY IU IUD
1.0000 | INTRAUTERINE_SYSTEM | Freq: Once | INTRAUTERINE | Status: AC
  Administered 2023-10-29: 1 via INTRAUTERINE

## 2023-10-29 NOTE — Progress Notes (Signed)
 Post Partum Visit Note  Andrea Dyer is a 23 y.o. G50P1001 female who presents for a postpartum visit. She is 6 weeks postpartum following a normal spontaneous vaginal delivery.  I have fully reviewed the prenatal and intrapartum course. The delivery was at 39 gestational weeks.  Anesthesia: epidural. Postpartum course has been unremarkable. Baby is doing well. Baby is feeding by breast. Bleeding no bleeding. Bowel function is normal. Bladder function is normal. Patient is sexually active. Contraception method is condoms. Postpartum depression screening: negative.   Upstream - 11/01/23 1419       Pregnancy Intention Screening   Does the patient want to become pregnant in the next year? No    Does the patient's partner want to become pregnant in the next year? No    Would the patient like to discuss contraceptive options today? Yes      Contraception Wrap Up   Current Method No Contraceptive Precautions    End Method IUD or IUS    Contraception Counseling Provided Yes    How was the end contraceptive method provided? Provided on site            The pregnancy intention screening data noted above was reviewed. Potential methods of contraception were discussed. The patient elected to proceed with IUD or IUS.   Health Maintenance Due  Topic Date Due   HPV VACCINES (1 - 3-dose series) Never done   COVID-19 Vaccine (1 - 2024-25 season) Never done   Cervical Cancer Screening (Pap smear)  Never done   The following portions of the patient's history were reviewed and updated as appropriate: allergies, current medications, past family history, past medical history, past social history, past surgical history, and problem list.  Review of Systems Pertinent items are noted in HPI.  Objective:  BP 124/83   Pulse 74   Ht 5\' 6"  (1.676 m)   Wt 139 lb (63 kg)   LMP 11/24/2022   Breastfeeding Yes   BMI 22.44 kg/m    General:  alert, cooperative, and no distress   Breasts:  not  indicated  Lungs: Normal effort  Heart:  Normal rate  Abdomen: Soft, nontender    Assessment:   Normal postpartum exam Encounter for IUD insertion (Mirena)  Plan:   Essential components of care per ACOG recommendations:  1.  Mood and well being: Patient with negative depression screening today. Reviewed local resources for support.  - Patient tobacco use? No.   - hx of drug use? No.    2. Infant care and feeding:  -Patient currently breastmilk feeding? Yes. Reviewed importance of draining breast regularly to support lactation.  -Social determinants of health (SDOH) reviewed in EPIC. No concerns  3. Sexuality, contraception and birth spacing - Patient does not want a pregnancy in the next year.  Desired family size is 2-3 children.  - Reviewed reproductive life planning. Reviewed contraceptive methods based on pt preferences and effectiveness.  Patient desired IUD or IUS today.   - Discussed birth spacing of 18 months  4. Sleep and fatigue -Encouraged family/partner/community support of 4 hrs of uninterrupted sleep to help with mood and fatigue  5. Physical Recovery  - Discussed patients delivery and complications. She describes her labor as good. - Patient had a Vaginal, no problems at delivery. Patient had a  labial  laceration. Perineal healing reviewed. Patient expressed understanding - Patient has urinary incontinence? No. - Patient is safe to resume physical and sexual activity  6.  Health  Maintenance - HM due items addressed Yes - Last pap smear No results found for: "DIAGPAP" Pap smear done at today's visit.  -Breast Cancer screening indicated? No.   7. Chronic Disease/Pregnancy Condition follow up: None  Lennart Pall, MD [facility] [employer], [facility]

## 2023-10-29 NOTE — Progress Notes (Signed)
    GYNECOLOGY OFFICE PROCEDURE NOTE  Andrea Dyer is a 23 y.o. G1P1001 here for IUD insertion. No GYN concerns.    IUD Insertion Procedure Note Procedure: IUD insertion with Mirena UPT: Negative GC/CT testing: Up to date Pap collected  Patient identified.  Risks, benefits and alternatives of procedure were discussed including irregular bleeding, cramping, infection, malpositioning or misplacement of the IUD outside the uterus which may require further procedure such as laparoscopy. Also discussed >99% contraception efficacy, increased risk of ectopic pregnancy with failure of method.   Emphasized that this did not protect against STIs, condoms recommended during all sexual encounters. Consent signed. Time out performed.   Speculum inserted and cervix visualized, prepped with 2 swabs of betadine.  Hurricaine spray was applied to the cervix followed by 5cc of intracervical lidocaine at 12 o'clock.  Grasped with a single tooth tenaculum. , Uterus sounded to 8 cm. , and IUD then inserted without difficulty per manufacturer's instructions and strings cut to 3 cm below cervical os and all instruments removed. Pt tolerated well with minimal pain and bleeding.  Silver nitrate applied to tenaculum sites.   Discussed concerning signs/symptoms and to call if heavy bleeding, severe abdominal pain, or fever in the following 3 weeks. Manufacturer pamphlet/patient information given. Reviewed timing of efficacy for contraception and to use an alternative form of birth control until that time.  Harvie Bridge, MD Obstetrician & Gynecologist, [facility] for [employer], [facility]

## 2023-11-01 ENCOUNTER — Encounter: Payer: Self-pay | Admitting: Obstetrics and Gynecology

## 2023-11-03 ENCOUNTER — Encounter: Payer: Self-pay | Admitting: Obstetrics and Gynecology

## 2023-11-03 LAB — CYTOLOGY - PAP: Diagnosis: NEGATIVE

## 2024-09-09 ENCOUNTER — Ambulatory Visit: Admitting: Obstetrics and Gynecology

## 2024-09-13 ENCOUNTER — Encounter (HOSPITAL_BASED_OUTPATIENT_CLINIC_OR_DEPARTMENT_OTHER): Payer: Self-pay

## 2024-09-13 ENCOUNTER — Emergency Department (HOSPITAL_BASED_OUTPATIENT_CLINIC_OR_DEPARTMENT_OTHER): Admission: EM | Admit: 2024-09-13 | Discharge: 2024-09-13 | Disposition: A | Discharge status: Home / Self Care

## 2024-09-13 ENCOUNTER — Other Ambulatory Visit: Payer: Self-pay

## 2024-09-13 DIAGNOSIS — R000 Tachycardia, unspecified: Secondary | ICD-10-CM | POA: Insufficient documentation

## 2024-09-13 DIAGNOSIS — R112 Nausea with vomiting, unspecified: Secondary | ICD-10-CM | POA: Insufficient documentation

## 2024-09-13 DIAGNOSIS — R002 Palpitations: Secondary | ICD-10-CM | POA: Insufficient documentation

## 2024-09-13 LAB — COMPREHENSIVE METABOLIC PANEL WITH GFR
ALT: 13 U/L (ref 0–44)
AST: 20 U/L (ref 15–41)
Albumin: 4.5 g/dL (ref 3.5–5.0)
Alkaline Phosphatase: 79 U/L (ref 38–126)
Anion gap: 17 — ABNORMAL HIGH (ref 5–15)
BUN: 17 mg/dL (ref 6–20)
CO2: 19 mmol/L — ABNORMAL LOW (ref 22–32)
Calcium: 9 mg/dL (ref 8.9–10.3)
Chloride: 104 mmol/L (ref 98–111)
Creatinine, Ser: 0.73 mg/dL (ref 0.44–1.00)
GFR, Estimated: >60 mL/min
Glucose, Bld: 119 mg/dL — ABNORMAL HIGH (ref 70–99)
Potassium: 3.8 mmol/L (ref 3.5–5.1)
Sodium: 140 mmol/L (ref 135–145)
Total Bilirubin: 0.7 mg/dL (ref 0.0–1.2)
Total Protein: 7.3 g/dL (ref 6.5–8.1)

## 2024-09-13 LAB — CBC
HCT: 40.2 % (ref 36.0–46.0)
Hemoglobin: 13.3 g/dL (ref 12.0–15.0)
MCH: 28.4 pg (ref 26.0–34.0)
MCHC: 33.1 g/dL (ref 30.0–36.0)
MCV: 85.7 fL (ref 80.0–100.0)
Platelets: 153 K/uL (ref 150–400)
RBC: 4.69 MIL/uL (ref 3.87–5.11)
RDW: 13.5 % (ref 11.5–15.5)
WBC: 7.3 K/uL (ref 4.0–10.5)
nRBC: 0 % (ref 0.0–0.2)

## 2024-09-13 LAB — URINALYSIS, MICROSCOPIC (REFLEX): WBC, UA: NONE SEEN WBC/hpf (ref 0–5)

## 2024-09-13 LAB — URINALYSIS, ROUTINE W REFLEX MICROSCOPIC
Glucose, UA: NEGATIVE mg/dL
Hgb urine dipstick: NEGATIVE
Ketones, ur: >=80 mg/dL — AB
Leukocytes,Ua: NEGATIVE
Nitrite: NEGATIVE
Protein, ur: 100 mg/dL — AB
Specific Gravity, Urine: >=1.03 (ref 1.005–1.030)
pH: 5.5 (ref 5.0–8.0)

## 2024-09-13 LAB — LIPASE, BLOOD: Lipase: 26 U/L (ref 11–51)

## 2024-09-13 LAB — PREGNANCY, URINE: Preg Test, Ur: NEGATIVE

## 2024-09-13 MED ORDER — SODIUM CHLORIDE 0.9 % IV BOLUS
1000.0000 mL | Freq: Once | INTRAVENOUS | Status: AC
  Administered 2024-09-13: 1000 mL via INTRAVENOUS

## 2024-09-13 MED ORDER — ONDANSETRON HCL 4 MG PO TABS: 4.0000 mg | ORAL_TABLET | Freq: Four times a day (QID) | ORAL | 0 refills | Status: AC

## 2024-09-13 MED ORDER — ONDANSETRON HCL 4 MG/2ML IJ SOLN
4.0000 mg | Freq: Once | INTRAMUSCULAR | Status: AC
  Administered 2024-09-13: 4 mg via INTRAVENOUS
  Filled 2024-09-13: qty 2

## 2024-09-13 NOTE — ED Provider Notes (Signed)
 " [place] Provider Note   CSN: [id number] Arrival date & time: 09/13/24  1432     Patient presents with: Abdominal Pain   Andrea Dyer is a 24 y.o. female. presents to the ER for nausea and vomiting that started last night.  Patient notes that she woke up around 2 AM and had several episodes of vomiting.  Denies blood in vomit.  Did not eat anything different last night for dinner.  She was seen in the urgent care today where her blood pressure was around 90/80 and had an elevated heart rate.  She was recommended to come here for further evaluation.  History of uncomplicated vaginal delivery a year ago.  Patient is currently breast-feeding.  Last menstrual cycle was about a year and a half ago.  Denies abdominal surgeries.  Denies alcohol or drug use.  Denies recent travel.  Denies sick contacts.  Denies taking any recent new medications.  Patient does endorse palpitations.  Denies heart or lung disease.  No history of blood clots.  Not on blood control pills.  No recent surgeries.  Denies fever, cough, chills, chest pain, shortness of breath, abdominal pain, diarrhea, weakness, numbness or tingling, headache, vaginal pain or discharge, back pain, hematuria, leg swelling, dizziness, or any other symptoms at this time.      Abdominal Pain Associated symptoms: nausea and vomiting        Prior to Admission medications  Medication Sig Start Date End Date Taking? Authorizing Provider  ondansetron  (ZOFRAN ) 4 MG tablet Take 1 tablet (4 mg total) by mouth every 6 (six) hours. 09/13/24  Yes Darryl Blumenstein, PA-C  ibuprofen  (ADVIL ) 600 MG tablet Take 1 tablet (600 mg total) by mouth every 6 (six) hours. 09/16/23   Von Reasoner, MD  omeprazole  (PRILOSEC) 20 MG capsule Take 1 capsule (20 mg total) by mouth daily. 07/30/23   Cleatus Moccasin, MD  Prenatal Vit-Fe Fumarate-FA (PRENATAL MULTIVITAMIN) TABS tablet Take 1 tablet by mouth daily at 12 noon.     [provider]  senna-docusate (SENOKOT-S) 8.6-50 MG tablet Take 2 tablets by mouth daily. Patient not taking: Reported on 10/29/2023 09/16/23   Kumar, Agnijita, MD  sertraline  (ZOLOFT ) 50 MG tablet Take 1 tablet (50 mg total) by mouth daily. 10/19/23   Cleatus Moccasin, MD    Allergies: Amoxicillin and Penicillins    Review of Systems  Cardiovascular:  Positive for palpitations.  Gastrointestinal:  Positive for nausea and vomiting.    Updated Vital Signs BP 127/84   Pulse 86   Temp 97.7 F (36.5 C)   Resp 18   SpO2 100%   Breastfeeding Yes   Physical Exam Vitals and nursing note reviewed.  Constitutional:      General: She is not in acute distress.    Appearance: She is well-developed.  HENT:     Head: Normocephalic and atraumatic.  Eyes:     Extraocular Movements: Extraocular movements intact.     Conjunctiva/sclera: Conjunctivae normal.     Pupils: Pupils are equal, round, and reactive to light.  Cardiovascular:     Rate and Rhythm: Regular rhythm. Tachycardia present.     Heart sounds: No murmur heard. Pulmonary:     Effort: Pulmonary effort is normal. No respiratory distress.     Breath sounds: Normal breath sounds. No decreased air movement. No decreased breath sounds, wheezing, rhonchi or rales.  Abdominal:     Palpations: Abdomen is soft.     Tenderness:  There is no abdominal tenderness. There is no right CVA tenderness, left CVA tenderness, guarding or rebound.  Musculoskeletal:        General: No swelling.     Cervical back: Neck supple.     Comments: No leg edema  Skin:    General: Skin is warm and dry.     Capillary Refill: Capillary refill takes less than 2 seconds.  Neurological:     Mental Status: She is alert.  Psychiatric:        Mood and Affect: Mood normal.     (all labs ordered are listed, but only abnormal results are displayed) Labs Reviewed  COMPREHENSIVE METABOLIC PANEL WITH GFR - Abnormal; Notable for the following components:       Result Value   CO2 19 (*)    Glucose, Bld 119 (*)    Anion gap 17 (*)    All other components within normal limits  URINALYSIS, ROUTINE W REFLEX MICROSCOPIC - Abnormal; Notable for the following components:   Bilirubin Urine SMALL (*)    Ketones, ur >=80 (*)    Protein, ur 100 (*)    All other components within normal limits  URINALYSIS, MICROSCOPIC (REFLEX) - Abnormal; Notable for the following components:   Bacteria, UA FEW (*)    All other components within normal limits  LIPASE, BLOOD  CBC  PREGNANCY, URINE    EKG: EKG Interpretation Date/Time:  Tuesday September 13 2024 15:17:35 EST Ventricular Rate:  94 PR Interval:  167 QRS Duration:  96 QT Interval:  377 QTC Calculation: 472 R Axis:   87  Text Interpretation: Sinus rhythm Right atrial enlargement Confirmed by Elnor Savant ([id number]) on 09/13/2024 3:22:04 PM  Radiology: No results found.   Procedures   Medications Ordered in the ED  sodium chloride  0.9 % bolus 1,000 mL (0 mLs Intravenous Stopped 09/13/24 1612)  ondansetron  (ZOFRAN ) injection 4 mg (4 mg Intravenous Given 09/13/24 1511)                                    Medical Decision Making Amount and/or Complexity of Data Reviewed Labs: ordered.   This patient presents to the ED for concern of nausea, vomiting, and palpitations. This involves an extensive number of treatment options, and is a complaint that carries with it a high risk of complications and morbidity.  The differential diagnosis includes dehydration, pregnancy, AAA, mesenteric ischemia, appendicitis, PE, diverticulitis, DKA, gastroenteritis, nephrolithiasis, pancreatitis, UTI, bowel obstruction, biliary disease, IBD, PUD, hepatitis, ectopic pregnancy     Additional history obtained:  Additional history obtained from EMR   Lab Tests:  I Ordered, and personally interpreted labs.  The pertinent results include: No signs of anemia or leukocytosis on CBC, mildly elevated anion gap but otherwise  unremarkable CMP and lipase within normal limits.  Ketones on UA but otherwise unremarkable and negative pregnancy test.    Cardiac Monitoring: / EKG:  The patient was maintained on a cardiac monitor.  I personally viewed and interpreted the cardiac monitored which showed an underlying rhythm of: normal sinus   ED Course / Critical interventions / Medication management   I ordered medication including fluids and nausea Reevaluation of the patient after these medicines showed that the patient improved I have reviewed the patients home medicines and have made adjustments as needed   Dispostion: Patient is a healthy 24 year old female presenting today from urgent care for nausea, vomiting, palpitations  that started last night.  She was evaluated at an urgent care where her blood pressure was low with an elevated heart rate and was recommended to come here for further evaluation.  Patient is currently breast-feeding.  Patient is alert and oriented with no apparent distress.  Well-appearing and appears comfortable on exam.  Abdomen soft nontender.  No CVA tenderness.  No signs of dehydration.  Lungs clear.  Heart tachycardic but regular rhythm.  Patient was initially tachycardic at 129 bpm and mildly hypotensive at 116/50.  Patient is afebrile at this time.   CBC without any signs of anemia or leukocytosis.  Lipase within normal limits.  Mild elevation of anion gap on CMP which is most likely related to her symptoms of nausea and vomiting but otherwise unremarkable.  Urinalysis revealed ketones but otherwise unremarkable.  Negative pregnancy test.  Orthostatic vitals stable.  EKG unremarkable.  The results were discussed with the patient. Low suspicions for PE as patient is PERC negative with low Wells score.  CT abdomen not necessary as there is no tenderness or abdominal pain. Patient was given Zofran  and fluids here.  Repeat vital signs stable with improving heart rate and blood pressure.   Tolerating p.o. Recommended patient to follow-up with primary care doctor for further evaluation.  Return precautions provided. Patient is in agreement with plan and is stable for discharge.         Final diagnoses:  Nausea and vomiting, unspecified vomiting type    ED Discharge Orders          Ordered    ondansetron  (ZOFRAN ) 4 MG tablet  Every 6 hours        09/13/24 1739               Ameerah Huffstetler, PA-C 09/13/24 1740  "

## 2024-09-13 NOTE — Discharge Instructions (Addendum)
 Your workup here was negative today.  Please keep yourself hydrated.  Can take Zofran  for nausea as needed.  Follow-up with your primary care doctor for further evaluation. Return for worsening vomiting, fevers, abdominal pain, dizziness, palpitations, chest pain, or weakness.

## 2024-09-13 NOTE — ED Notes (Signed)
 While doing orthostatic vitals patient began to get dizzy at the standing for 3 mins. The patient got back to bed and I Community Education Officer.

## 2024-09-13 NOTE — ED Triage Notes (Signed)
 Reports generalized abd pain, emesis since this afternoon.  Denies diarrhea, urinary symptoms, fevers.   States was sent from [facility] for low BP and elevated HR
